# Patient Record
Sex: Male | Born: 1945 | Race: White | Hispanic: No | Smoking: Never smoker
Health system: Southern US, Community
[De-identification: ages and names within clinical notes are randomized; demographics above are authoritative.]

## PROBLEM LIST (undated history)

## (undated) DIAGNOSIS — D649 Anemia, unspecified: Secondary | ICD-10-CM

## (undated) DIAGNOSIS — E785 Hyperlipidemia, unspecified: Secondary | ICD-10-CM

## (undated) DIAGNOSIS — N400 Enlarged prostate without lower urinary tract symptoms: Secondary | ICD-10-CM

## (undated) HISTORY — PX: VASECTOMY: SHX75

## (undated) HISTORY — DX: Hyperlipidemia, unspecified: E78.5

---

## 1999-02-20 ENCOUNTER — Ambulatory Visit (HOSPITAL_BASED_OUTPATIENT_CLINIC_OR_DEPARTMENT_OTHER): Admission: RE | Admit: 1999-02-20 | Discharge: 1999-02-20 | Payer: Self-pay | Admitting: Plastic Surgery

## 2003-09-20 ENCOUNTER — Ambulatory Visit (HOSPITAL_BASED_OUTPATIENT_CLINIC_OR_DEPARTMENT_OTHER): Admission: RE | Admit: 2003-09-20 | Discharge: 2003-09-20 | Payer: Self-pay | Admitting: Plastic Surgery

## 2003-09-20 ENCOUNTER — Encounter (INDEPENDENT_AMBULATORY_CARE_PROVIDER_SITE_OTHER): Payer: Self-pay | Admitting: Specialist

## 2003-09-20 ENCOUNTER — Ambulatory Visit (HOSPITAL_COMMUNITY): Admission: RE | Admit: 2003-09-20 | Discharge: 2003-09-20 | Payer: Self-pay | Admitting: Plastic Surgery

## 2005-07-23 ENCOUNTER — Encounter: Admission: RE | Admit: 2005-07-23 | Discharge: 2005-07-23 | Payer: Self-pay | Admitting: Orthopedic Surgery

## 2005-08-06 ENCOUNTER — Encounter: Admission: RE | Admit: 2005-08-06 | Discharge: 2005-08-06 | Payer: Self-pay | Admitting: Orthopedic Surgery

## 2006-07-16 ENCOUNTER — Encounter: Admission: RE | Admit: 2006-07-16 | Discharge: 2006-07-16 | Payer: Self-pay | Admitting: Orthopedic Surgery

## 2006-08-06 ENCOUNTER — Encounter: Admission: RE | Admit: 2006-08-06 | Discharge: 2006-08-06 | Payer: Self-pay | Admitting: Orthopedic Surgery

## 2006-08-06 DIAGNOSIS — M5416 Radiculopathy, lumbar region: Secondary | ICD-10-CM | POA: Insufficient documentation

## 2008-09-14 ENCOUNTER — Emergency Department (HOSPITAL_COMMUNITY): Admission: EM | Admit: 2008-09-14 | Discharge: 2008-09-14 | Payer: Self-pay | Admitting: Emergency Medicine

## 2011-02-23 NOTE — Op Note (Signed)
NAME:  BUCKY, GRIGG                       ACCOUNT NO.:  [id number]   MEDICAL RECORD NO.:  [id number]                   PATIENT TYPE:  AMB   LOCATION:  DSC                                  FACILITY:  [facility]   PHYSICIAN:  Alfredia Ferguson, M.D.               DATE OF BIRTH:  22-Feb-1946   DATE OF PROCEDURE:  09/20/2003  DATE OF DISCHARGE:                                 OPERATIVE REPORT   PREOPERATIVE DIAGNOSIS:  A 5 mm actinic keratosis with severe atypia, left  cheek.   POSTOPERATIVE DIAGNOSIS:  A 5 mm actinic keratosis with severe atypia, left  cheek.   OPERATION PERFORMED:  Elliptical excision of actinic keratosis, left cheek,  with approximately 2 mm margin.   SURGEON:  Alfredia Ferguson, M.D.   ANESTHESIA:  2% Xylocaine, 1:100,000 epinephrine.   INDICATION FOR SURGERY:  This is a 65 year old gentleman with a biopsy-  proven actinic keratosis with severe atypia.  He wishes to undergo further  excision to clear the margins.  Potential risks of the surgery, including  unsightly scarring and positive margins, were discussed with the patient.  He wishes to proceed.   DESCRIPTION OF SURGERY:  Skin marks were placed around the lesion in  elliptical fashion and local anesthesia using 2% Xylocaine and 1:100,000  epinephrine was infiltrated.  The left cheek was prepped and draped in a  sterile fashion.  An elliptical excision of the lesion down to the level of  the subcutaneous tissue was carried out.  The specimen was passed off for  pathology.  The wound edges were undermined for a distance of several  millimeters in all directions.  The wound was closed by approximating the  dermis using interrupted 5-0 Monocryl suture.  Skin was united using a  running 6-0 nylon suture.  The cheek was cleansed, dried, and a light  dressing was applied.  Follow-up will be provided in four days.                                               Alfredia Ferguson, M.D.    WBB/MEDQ  D:  09/20/2003  T:   09/20/2003  Job:  [id number]

## 2011-07-13 LAB — URINALYSIS, ROUTINE W REFLEX MICROSCOPIC
Bilirubin Urine: NEGATIVE
Glucose, UA: NEGATIVE mg/dL
Hgb urine dipstick: NEGATIVE
Ketones, ur: 15 mg/dL — AB
Nitrite: NEGATIVE
Protein, ur: NEGATIVE mg/dL
Specific Gravity, Urine: 1.028 (ref 1.005–1.030)
Urobilinogen, UA: 0.2 mg/dL (ref 0.0–1.0)
pH: 5.5 (ref 5.0–8.0)

## 2011-07-13 LAB — COMPREHENSIVE METABOLIC PANEL
ALT: 15 U/L (ref 0–53)
AST: 16 U/L (ref 0–37)
Albumin: 3.8 g/dL (ref 3.5–5.2)
Alkaline Phosphatase: 48 U/L (ref 39–117)
BUN: 11 mg/dL (ref 6–23)
CO2: 26 mEq/L (ref 19–32)
Calcium: 8.9 mg/dL (ref 8.4–10.5)
Chloride: 105 mEq/L (ref 96–112)
Creatinine, Ser: 0.96 mg/dL (ref 0.4–1.5)
GFR calc Af Amer: >60 mL/min (ref >60)
GFR calc non Af Amer: >60 mL/min (ref >60)
Glucose, Bld: 127 mg/dL — ABNORMAL HIGH (ref 70–99)
Potassium: 3.5 mEq/L (ref 3.5–5.1)
Sodium: 136 mEq/L (ref 135–145)
Total Bilirubin: 1.3 mg/dL — ABNORMAL HIGH (ref 0.3–1.2)
Total Protein: 6 g/dL (ref 6.0–8.3)

## 2011-07-13 LAB — CBC
HCT: 43.6 % (ref 39.0–52.0)
Hemoglobin: 14.8 g/dL (ref 13.0–17.0)
MCHC: 34 g/dL (ref 30.0–36.0)
MCV: 96 fL (ref 78.0–100.0)
Platelets: 254 10*3/uL (ref 150–400)
RBC: 4.54 MIL/uL (ref 4.22–5.81)
RDW: 13.1 % (ref 11.5–15.5)
WBC: 10.5 10*3/uL (ref 4.0–10.5)

## 2011-07-13 LAB — DIFFERENTIAL
Basophils Absolute: 0 10*3/uL (ref 0.0–0.1)
Basophils Relative: 0 % (ref 0–1)
Eosinophils Absolute: 0 10*3/uL (ref 0.0–0.7)
Eosinophils Relative: 0 % (ref 0–5)
Lymphocytes Relative: 6 % — ABNORMAL LOW (ref 12–46)
Lymphs Abs: 0.7 10*3/uL (ref 0.7–4.0)
Monocytes Absolute: 0.4 10*3/uL (ref 0.1–1.0)
Monocytes Relative: 4 % (ref 3–12)
Neutro Abs: 9.5 10*3/uL — ABNORMAL HIGH (ref 1.7–7.7)
Neutrophils Relative %: 90 % — ABNORMAL HIGH (ref 43–77)

## 2011-07-13 LAB — LIPASE, BLOOD: Lipase: 25 U/L (ref 11–59)

## 2011-07-13 LAB — POCT CARDIAC MARKERS
CKMB, poc: 1.5 ng/mL (ref 1.0–8.0)
Myoglobin, poc: 92.5 ng/mL (ref 12–200)
Troponin i, poc: <0.05 ng/mL (ref 0.00–0.09)

## 2014-12-26 ENCOUNTER — Ambulatory Visit (INDEPENDENT_AMBULATORY_CARE_PROVIDER_SITE_OTHER): Payer: Medicare Other | Admitting: Emergency Medicine

## 2014-12-26 ENCOUNTER — Ambulatory Visit (INDEPENDENT_AMBULATORY_CARE_PROVIDER_SITE_OTHER): Payer: Medicare Other

## 2014-12-26 VITALS — BP 118/66 | HR 82 | Temp 97.8°F | Resp 16 | Ht 69.0 in | Wt 179.2 lb

## 2014-12-26 DIAGNOSIS — M25572 Pain in left ankle and joints of left foot: Secondary | ICD-10-CM

## 2014-12-26 DIAGNOSIS — S92302A Fracture of unspecified metatarsal bone(s), left foot, initial encounter for closed fracture: Secondary | ICD-10-CM

## 2014-12-26 DIAGNOSIS — S8012XA Contusion of left lower leg, initial encounter: Secondary | ICD-10-CM

## 2014-12-26 NOTE — Patient Instructions (Signed)
Metatarsal Fracture, Undisplaced  A metatarsal fracture is a break in the bone(s) of the foot. These are the bones of the foot that connect your toes to the bones of the ankle.  DIAGNOSIS   The diagnoses of these fractures are usually made with X-rays. If there are problems in the forefoot and x-rays are normal a later bone scan will usually make the diagnosis.   TREATMENT AND HOME CARE INSTRUCTIONS  · Treatment may or may not include a cast or walking shoe. When casts are needed the use is usually for short periods of time so as not to slow down healing with muscle wasting (atrophy).  · Activities should be stopped until further advised by your caregiver.  · Wear shoes with adequate shock absorbing capabilities and stiff soles.  · Alternative exercise may be undertaken while waiting for healing. These may include bicycling and swimming, or as your caregiver suggests.  · It is important to keep all follow-up visits or specialty referrals. The failure to keep these appointments could result in improper bone healing and chronic pain or disability.  · Warning: Do not drive a car or operate a motor vehicle until your caregiver specifically tells you it is safe to do so.  IF YOU DO NOT HAVE A CAST OR SPLINT:  · You may walk on your injured foot as tolerated or advised.  · Do not put any weight on your injured foot for as long as directed by your caregiver. Slowly increase the amount of time you walk on the foot as the pain allows or as advised.  · Use crutches until you can bear weight without pain. A gradual increase in weight bearing may help.  · Apply ice to the injury for 15-20 minutes each hour while awake for the first 2 days. Put the ice in a plastic bag and place a towel between the bag of ice and your skin.  · Only take over-the-counter or prescription medicines for pain, discomfort, or fever as directed by your caregiver.  SEEK IMMEDIATE MEDICAL CARE IF:   · Your cast gets damaged or breaks.  · You have  continued severe pain or more swelling than you did before the cast was put on, or the pain is not controlled with medications.  · Your skin or nails below the injury turn blue or grey, or feel cold or numb.  · There is a bad smell, or new stains or pus-like (purulent) drainage coming from the cast.  MAKE SURE YOU:   · Understand these instructions.  · Will watch your condition.  · Will get help right away if you are not doing well or get worse.  Document Released: 06/16/2002 Document Revised: 12/17/2011 Document Reviewed: 05/07/2008  ExitCare® Patient Information ©2015 [employer]. This information is not intended to replace advice given to you by your health care provider. Make sure you discuss any questions you have with your health care provider.

## 2014-12-26 NOTE — Progress Notes (Signed)
[facility] [address] [place] [address] [phone]  Date:  12/26/2014   Name:  David Mahoney   DOB:  07/14/1946   MRN:  [id number]  PCP:  No PCP Per Patient    Chief Complaint: Foot Pain and Leg Pain   History of Present Illness:  David Mahoney is a 69 y.o. very pleasant male patient who presents with the following:  [place] off a 6 foot step ladder while painting a week ago. Landed on his left side. Has a very large hematoma left lateral thigh with distal migration and swelling of lower leg Pain with ambulation in foot, mostly on lateral foot. No head neck or back pain or injury.  No LOC no neuro symptoms No improvement with over the counter medications or other home remedies.  Denies other complaint or health concern today.   There are no active problems to display for this patient.   Past Medical History  Diagnosis Date  . Hyperlipidemia     Past Surgical History  Procedure Laterality Date  . Vasectomy      History  Substance Use Topics  . Smoking status: Never Smoker   . Smokeless tobacco: Not on file  . Alcohol Use: Not on file    Family History  Problem Relation Age of Onset  . Heart disease Mother   . Diabetes Father   . Diabetes Brother     No Known Allergies  Medication list has been reviewed and updated.  No current outpatient prescriptions on file prior to visit.   No current facility-administered medications on file prior to visit.    Review of Systems:  As per HPI, otherwise negative.    Physical Examination: Filed Vitals:   12/26/14 1432  BP: 118/66  Pulse: 82  Temp: 97.8 F (36.6 C)  Resp: 16   Filed Vitals:   12/26/14 1432  Height: 5\' 9"  (1.753 m)  Weight: 179 lb 3.2 oz (81.285 kg)   Body mass index is 26.45 kg/(m^2). Ideal Body Weight: Weight in (lb) to have BMI = 25: 168.9  GEN: WDWN, NAD, Non-toxic, A & O x 3 HEENT: Atraumatic, Normocephalic. Neck supple. No masses, No LAD. Ears  and Nose: No external deformity. CV: RRR, No M/G/R. No JVD. No thrill. No extra heart sounds. PULM: CTA B, no wheezes, crackles, rhonchi. No retractions. No resp. distress. No accessory muscle use. ABD: S, NT, ND, +BS. No rebound. No HSM. EXTR: No c/c/e NEURO Normal gait.  PSYCH: Normally interactive. Conversant. Not depressed or anxious appearing.  Calm demeanor.  LEFT thigh:  Lateral abrasion and large ecchymosis.  Some tenderness CALF:  Moderate swelling and ecchymosis Foot:  Tenderness lateral foot.  Marked ecchymosis and swelling   Assessment and Plan: Fracture fifth MT Boot Large ecchymosis left leg Follow up [facility]   Signed,  Ellison Carwin, MD   [facility] reading (PRIMARY) by  Dr. Ouida Sills.  Fracture fifth MT.

## 2016-06-07 DIAGNOSIS — E7800 Pure hypercholesterolemia, unspecified: Secondary | ICD-10-CM | POA: Insufficient documentation

## 2018-01-14 DIAGNOSIS — H903 Sensorineural hearing loss, bilateral: Secondary | ICD-10-CM | POA: Insufficient documentation

## 2019-12-14 DIAGNOSIS — E785 Hyperlipidemia, unspecified: Secondary | ICD-10-CM | POA: Diagnosis not present

## 2019-12-14 DIAGNOSIS — Z125 Encounter for screening for malignant neoplasm of prostate: Secondary | ICD-10-CM | POA: Diagnosis not present

## 2020-02-18 DIAGNOSIS — Z1331 Encounter for screening for depression: Secondary | ICD-10-CM | POA: Diagnosis not present

## 2020-02-18 DIAGNOSIS — Z719 Counseling, unspecified: Secondary | ICD-10-CM | POA: Diagnosis not present

## 2020-02-18 DIAGNOSIS — Z789 Other specified health status: Secondary | ICD-10-CM | POA: Diagnosis not present

## 2020-02-18 DIAGNOSIS — Z7189 Other specified counseling: Secondary | ICD-10-CM | POA: Diagnosis not present

## 2020-02-18 DIAGNOSIS — Z713 Dietary counseling and surveillance: Secondary | ICD-10-CM | POA: Diagnosis not present

## 2020-02-18 DIAGNOSIS — Z0000 Encounter for general adult medical examination without abnormal findings: Secondary | ICD-10-CM | POA: Diagnosis not present

## 2020-02-21 DIAGNOSIS — S41131A Puncture wound without foreign body of right upper arm, initial encounter: Secondary | ICD-10-CM | POA: Diagnosis not present

## 2020-02-21 DIAGNOSIS — S41051A Open bite of right shoulder, initial encounter: Secondary | ICD-10-CM | POA: Diagnosis not present

## 2020-02-21 DIAGNOSIS — S41151A Open bite of right upper arm, initial encounter: Secondary | ICD-10-CM | POA: Diagnosis not present

## 2020-02-21 DIAGNOSIS — Z23 Encounter for immunization: Secondary | ICD-10-CM | POA: Diagnosis not present

## 2020-06-14 DIAGNOSIS — N3001 Acute cystitis with hematuria: Secondary | ICD-10-CM | POA: Diagnosis not present

## 2020-11-14 DIAGNOSIS — Z6825 Body mass index (BMI) 25.0-25.9, adult: Secondary | ICD-10-CM | POA: Diagnosis not present

## 2020-11-14 DIAGNOSIS — E785 Hyperlipidemia, unspecified: Secondary | ICD-10-CM | POA: Diagnosis not present

## 2020-11-14 DIAGNOSIS — G3184 Mild cognitive impairment, so stated: Secondary | ICD-10-CM | POA: Diagnosis not present

## 2020-11-14 DIAGNOSIS — N400 Enlarged prostate without lower urinary tract symptoms: Secondary | ICD-10-CM | POA: Diagnosis not present

## 2020-11-14 DIAGNOSIS — Z23 Encounter for immunization: Secondary | ICD-10-CM | POA: Diagnosis not present

## 2020-11-14 DIAGNOSIS — E782 Mixed hyperlipidemia: Secondary | ICD-10-CM | POA: Diagnosis not present

## 2020-11-14 DIAGNOSIS — Z634 Disappearance and death of family member: Secondary | ICD-10-CM | POA: Diagnosis not present

## 2020-11-14 DIAGNOSIS — F329 Major depressive disorder, single episode, unspecified: Secondary | ICD-10-CM | POA: Diagnosis not present

## 2021-05-26 DIAGNOSIS — R222 Localized swelling, mass and lump, trunk: Secondary | ICD-10-CM | POA: Diagnosis not present

## 2021-05-26 DIAGNOSIS — Z6826 Body mass index (BMI) 26.0-26.9, adult: Secondary | ICD-10-CM | POA: Diagnosis not present

## 2021-05-26 DIAGNOSIS — M25412 Effusion, left shoulder: Secondary | ICD-10-CM | POA: Diagnosis not present

## 2022-02-23 ENCOUNTER — Encounter (HOSPITAL_COMMUNITY): Payer: Self-pay | Admitting: Emergency Medicine

## 2022-02-23 ENCOUNTER — Other Ambulatory Visit: Payer: Self-pay

## 2022-02-23 ENCOUNTER — Inpatient Hospital Stay (HOSPITAL_COMMUNITY)
Admission: EM | Admit: 2022-02-23 | Discharge: 2022-02-25 | DRG: 375 | Disposition: A | Discharge status: Home / Self Care | Payer: Medicare Other | Attending: Internal Medicine | Admitting: Internal Medicine

## 2022-02-23 DIAGNOSIS — K922 Gastrointestinal hemorrhage, unspecified: Secondary | ICD-10-CM

## 2022-02-23 DIAGNOSIS — D649 Anemia, unspecified: Secondary | ICD-10-CM | POA: Diagnosis not present

## 2022-02-23 DIAGNOSIS — D62 Acute posthemorrhagic anemia: Secondary | ICD-10-CM | POA: Diagnosis present

## 2022-02-23 DIAGNOSIS — K648 Other hemorrhoids: Secondary | ICD-10-CM | POA: Diagnosis present

## 2022-02-23 DIAGNOSIS — Z833 Family history of diabetes mellitus: Secondary | ICD-10-CM

## 2022-02-23 DIAGNOSIS — Z8249 Family history of ischemic heart disease and other diseases of the circulatory system: Secondary | ICD-10-CM | POA: Diagnosis not present

## 2022-02-23 DIAGNOSIS — N400 Enlarged prostate without lower urinary tract symptoms: Secondary | ICD-10-CM | POA: Diagnosis present

## 2022-02-23 DIAGNOSIS — C182 Malignant neoplasm of ascending colon: Secondary | ICD-10-CM | POA: Diagnosis present

## 2022-02-23 DIAGNOSIS — E785 Hyperlipidemia, unspecified: Secondary | ICD-10-CM | POA: Diagnosis present

## 2022-02-23 DIAGNOSIS — K5730 Diverticulosis of large intestine without perforation or abscess without bleeding: Secondary | ICD-10-CM | POA: Diagnosis present

## 2022-02-23 DIAGNOSIS — K5520 Angiodysplasia of colon without hemorrhage: Secondary | ICD-10-CM | POA: Diagnosis present

## 2022-02-23 DIAGNOSIS — K2289 Other specified disease of esophagus: Secondary | ICD-10-CM | POA: Diagnosis not present

## 2022-02-23 DIAGNOSIS — Z79899 Other long term (current) drug therapy: Secondary | ICD-10-CM

## 2022-02-23 DIAGNOSIS — D490 Neoplasm of unspecified behavior of digestive system: Secondary | ICD-10-CM | POA: Diagnosis not present

## 2022-02-23 DIAGNOSIS — K921 Melena: Secondary | ICD-10-CM | POA: Diagnosis not present

## 2022-02-23 DIAGNOSIS — Z7982 Long term (current) use of aspirin: Secondary | ICD-10-CM

## 2022-02-23 DIAGNOSIS — K449 Diaphragmatic hernia without obstruction or gangrene: Secondary | ICD-10-CM | POA: Diagnosis present

## 2022-02-23 DIAGNOSIS — D120 Benign neoplasm of cecum: Secondary | ICD-10-CM | POA: Diagnosis present

## 2022-02-23 DIAGNOSIS — K2950 Unspecified chronic gastritis without bleeding: Secondary | ICD-10-CM | POA: Diagnosis not present

## 2022-02-23 DIAGNOSIS — N401 Enlarged prostate with lower urinary tract symptoms: Secondary | ICD-10-CM | POA: Diagnosis not present

## 2022-02-23 DIAGNOSIS — H9190 Unspecified hearing loss, unspecified ear: Secondary | ICD-10-CM | POA: Diagnosis not present

## 2022-02-23 DIAGNOSIS — G3184 Mild cognitive impairment, so stated: Secondary | ICD-10-CM | POA: Diagnosis present

## 2022-02-23 DIAGNOSIS — K229 Disease of esophagus, unspecified: Secondary | ICD-10-CM | POA: Diagnosis present

## 2022-02-23 DIAGNOSIS — K635 Polyp of colon: Secondary | ICD-10-CM | POA: Diagnosis not present

## 2022-02-23 HISTORY — DX: Benign prostatic hyperplasia without lower urinary tract symptoms: N40.0

## 2022-02-23 LAB — CBC WITH DIFFERENTIAL/PLATELET
Abs Immature Granulocytes: 0.03 10*3/uL (ref 0.00–0.07)
Basophils Absolute: 0 10*3/uL (ref 0.0–0.1)
Basophils Relative: 0 %
Eosinophils Absolute: 0 10*3/uL (ref 0.0–0.5)
Eosinophils Relative: 1 %
HCT: 20.9 % — ABNORMAL LOW (ref 39.0–52.0)
Hemoglobin: 5.7 g/dL — CL (ref 13.0–17.0)
Immature Granulocytes: 0 %
Lymphocytes Relative: 17 %
Lymphs Abs: 1.3 10*3/uL (ref 0.7–4.0)
MCH: 19.3 pg — ABNORMAL LOW (ref 26.0–34.0)
MCHC: 27.3 g/dL — ABNORMAL LOW (ref 30.0–36.0)
MCV: 70.8 fL — ABNORMAL LOW (ref 80.0–100.0)
Monocytes Absolute: 0.6 10*3/uL (ref 0.1–1.0)
Monocytes Relative: 7 %
Neutro Abs: 5.7 10*3/uL (ref 1.7–7.7)
Neutrophils Relative %: 75 %
Platelets: 387 10*3/uL (ref 150–400)
RBC: 2.95 MIL/uL — ABNORMAL LOW (ref 4.22–5.81)
RDW: 17 % — ABNORMAL HIGH (ref 11.5–15.5)
WBC: 7.7 10*3/uL (ref 4.0–10.5)
nRBC: 0 % (ref 0.0–0.2)

## 2022-02-23 LAB — COMPREHENSIVE METABOLIC PANEL
ALT: 19 U/L (ref 0–44)
AST: 17 U/L (ref 15–41)
Albumin: 4.1 g/dL (ref 3.5–5.0)
Alkaline Phosphatase: 41 U/L (ref 38–126)
Anion gap: 9 (ref 5–15)
BUN: 15 mg/dL (ref 8–23)
CO2: 20 mmol/L — ABNORMAL LOW (ref 22–32)
Calcium: 8.8 mg/dL — ABNORMAL LOW (ref 8.9–10.3)
Chloride: 108 mmol/L (ref 98–111)
Creatinine, Ser: 1.21 mg/dL (ref 0.61–1.24)
GFR, Estimated: >60 mL/min (ref >60)
Glucose, Bld: 115 mg/dL — ABNORMAL HIGH (ref 70–99)
Potassium: 4.1 mmol/L (ref 3.5–5.1)
Sodium: 137 mmol/L (ref 135–145)
Total Bilirubin: 1.1 mg/dL (ref 0.3–1.2)
Total Protein: 6.5 g/dL (ref 6.5–8.1)

## 2022-02-23 LAB — PREPARE RBC (CROSSMATCH)

## 2022-02-23 LAB — PROTIME-INR
INR: 1 (ref 0.8–1.2)
Prothrombin Time: 13.2 seconds (ref 11.4–15.2)

## 2022-02-23 LAB — ABO/RH: ABO/RH(D): A NEG

## 2022-02-23 LAB — POC OCCULT BLOOD, ED: Fecal Occult Bld: POSITIVE — AB

## 2022-02-23 MED ORDER — SODIUM CHLORIDE 0.9 % IV SOLN: 10.0000 mL/h | Freq: Once | INTRAVENOUS | Status: DC

## 2022-02-23 MED ORDER — PANTOPRAZOLE SODIUM 40 MG IV SOLR
40.0000 mg | Freq: Two times a day (BID) | INTRAVENOUS | Status: DC
  Administered 2022-02-24 – 2022-02-25 (×3): 40 mg via INTRAVENOUS
  Filled 2022-02-23 (×3): qty 10

## 2022-02-23 MED ORDER — PANTOPRAZOLE SODIUM 40 MG IV SOLR
40.0000 mg | Freq: Once | INTRAVENOUS | Status: AC
  Administered 2022-02-23: 40 mg via INTRAVENOUS
  Filled 2022-02-23: qty 10

## 2022-02-23 MED ORDER — LORAZEPAM 2 MG/ML IJ SOLN
0.2500 mg | Freq: Once | INTRAMUSCULAR | Status: AC
Start: 2022-02-24 — End: 2022-02-24
  Administered 2022-02-24: 0.25 mg via INTRAVENOUS
  Filled 2022-02-23: qty 1

## 2022-02-23 NOTE — ED Provider Notes (Signed)
[facility] EMERGENCY DEPARTMENT Provider Note   CSN: [id number] Arrival date & time: 02/23/22  1454     History  Chief Complaint  Patient presents with   Rectal Bleeding    David Mahoney is a 76 y.o. male.  Patient presents ER chief complaint of bloody stool.  Patient lives alone but states that he had a bloody stool yesterday.  He called his daughter today to let her know when he was brought to the primary care doctor's office.  Blood tests were done showing hemoglobin 5.5, advised to go to the ER.  Patient states the bleeding appears to have stopped today.  Denies any headache or chest pain or abdominal pain no prior history of GI bleed.  Does not have a gastroenterologist, otherwise denies any fevers or cough or vomiting or diarrhea.  Takes baby aspirin daily which she took today.      Home Medications Prior to Admission medications   Not on File      Allergies    Patient has no known allergies.    Review of Systems   Review of Systems  Constitutional:  Negative for fever.  HENT:  Negative for ear pain and sore throat.   Eyes:  Negative for pain.  Respiratory:  Negative for cough.   Cardiovascular:  Negative for chest pain.  Gastrointestinal:  Negative for abdominal pain.  Genitourinary:  Negative for flank pain.  Musculoskeletal:  Negative for back pain.  Skin:  Negative for color change and rash.  Neurological:  Negative for syncope.  All other systems reviewed and are negative.  Physical Exam Updated Vital Signs BP 130/77 (BP Location: Left Arm)   Pulse 74   Temp (!) 97.5 F (36.4 C) (Oral)   Resp 16   Ht '5\' 8"'$  (1.727 m)   Wt 74.8 kg   SpO2 100%   BMI 25.09 kg/m  Physical Exam Constitutional:      Appearance: He is well-developed.  HENT:     Head: Normocephalic.     Nose: Nose normal.  Eyes:     Extraocular Movements: Extraocular movements intact.  Cardiovascular:     Rate and Rhythm: Normal rate.  Pulmonary:     Effort:  Pulmonary effort is normal.  Genitourinary:    Comments: Rectal exam shows sparse, dark appearing stool.  Guaiac positive. Skin:    Coloration: Skin is not jaundiced.  Neurological:     Mental Status: He is alert. Mental status is at baseline.    ED Results / Procedures / Treatments   Labs (all labs ordered are listed, but only abnormal results are displayed) Labs Reviewed  COMPREHENSIVE METABOLIC PANEL - Abnormal; Notable for the following components:      Result Value   CO2 20 (*)    Glucose, Bld 115 (*)    Calcium 8.8 (*)    All other components within normal limits  CBC WITH DIFFERENTIAL/PLATELET - Abnormal; Notable for the following components:   RBC 2.95 (*)    Hemoglobin 5.7 (*)    HCT 20.9 (*)    MCV 70.8 (*)    MCH 19.3 (*)    MCHC 27.3 (*)    RDW 17.0 (*)    All other components within normal limits  POC OCCULT BLOOD, ED - Abnormal; Notable for the following components:   Fecal Occult Bld POSITIVE (*)    All other components within normal limits  PROTIME-INR  TYPE AND SCREEN  ABO/RH  PREPARE RBC (CROSSMATCH)  EKG None  Radiology No results found.  Procedures .Critical Care Performed by: Luna Fuse, MD Authorized by: Luna Fuse, MD   Critical care provider statement:    Critical care time (minutes):  40   Critical care time was exclusive of:  Separately billable procedures and treating other patients and teaching time   Critical care was necessary to treat or prevent imminent or life-threatening deterioration of the following conditions:  Circulatory failure Comments:     Severe anemia, emergent blood transfusion    Medications Ordered in ED Medications  0.9 %  sodium chloride infusion (has no administration in time range)  pantoprazole (PROTONIX) injection 40 mg (40 mg Intravenous Given 02/23/22 1746)    ED Course/ Medical Decision Making/ A&P                           Medical Decision Making Amount and/or Complexity of Data  Reviewed Labs: ordered.  Risk Prescription drug management.   History obtained from daughter at bedside.  Cardiac monitoring showing sinus rhythm.  Review of records shows prior PCP visit today.   Labs are sent, concerning for hemoglobin 5.7.  Chemistry otherwise unremarkable.  Risk and benefits discussed, patient to be transfused 2 units of PRBCs.  Started on IV Protonix no drip started after consultation with GI consistent with the recommendations.  Will be mated to the hospitalist team.  Case discussed with on-call [facility] Dr.Gessner, who will see the patient tomorrow morning.        Final Clinical Impression(s) / ED Diagnoses Final diagnoses:  Gastrointestinal hemorrhage, unspecified gastrointestinal hemorrhage type  Severe anemia    Rx / DC Orders ED Discharge Orders     None         Luna Fuse, MD 02/23/22 1750

## 2022-02-23 NOTE — ED Triage Notes (Signed)
Pt. Stated, I had an episode of bleeding rectally yesterday, it keep on coming and it finally stopped no more episodes.

## 2022-02-23 NOTE — ED Provider Triage Note (Addendum)
Emergency Medicine Provider Triage Evaluation Note  David Mahoney , a 76 y.o. male  was evaluated in triage.  Pt complains of rectal bleeding onset yesterday.  Patient notes that he noted rectal bleeding when wiping and in the toilet as well.  He has his rectal bleeding has resolved at this time.  No meds prior to arrival.  Denies chest pain, shortness of breath, palpitations, abdominal pain. Denies history of hemorrhoids. Denies anticoagulants.   Review of Systems  Positive: As per HPI above Negative:   Physical Exam  BP 130/77 (BP Location: Left Arm)   Pulse 74   Temp (!) 97.5 F (36.4 C) (Oral)   Resp 16   Ht '5\' 8"'$  (1.727 m)   Wt 74.8 kg   SpO2 100%   BMI 25.09 kg/m  Gen:   Awake, no distress   Resp:  Normal effort  MSK:   Moves extremities without difficulty  Other:  Rectal exam deferred in triage.  No abdominal tenderness to palpation  Medical Decision Making  Medically screening exam initiated at 3:02 PM.  Appropriate orders placed.  David Mahoney was informed that the remainder of the evaluation will be completed by another provider, this initial triage assessment does not replace that evaluation, and the importance of remaining in the ED until their evaluation is complete.  Work-up initiated.   Dionicio Shelnutt A, PA-C 02/23/22 1515    Marymargaret Kirker A, PA-C 02/23/22 1516

## 2022-02-23 NOTE — H&P (Addendum)
History and Physical    David Mahoney UXL:[id number] DOB: Nov 26, 1945 DOA: 02/23/2022  PCP: [name], MD (Confirm with patient/family/NH records and if not entered, this has to be entered at [facility] point of entry) Patient coming from: Home  I have personally briefly reviewed patient's old medical records in [place]  Chief Complaint: Bleeding stopped  HPI: David Mahoney is a 76 y.o. male with medical history significant of mild cognitive impairment, came with new onset of rectal bleed.  Baseline, patient lives by himself after wife passed away 2 years ago.  No history of GI bleed and no history of either EGD or colonoscopy.  Patient woke up this morning and bathroom and had a large bloody bowel movement, which he described as liquid form bright red blood " pouring into the bottom of the toilet bowl" only 1 episode.  She was not sure about whether was any blood clot.  Denies any abdominal pain, no nauseous vomiting, no lightheadedness, no chest pain or shortness of breath.  Denies any weight loss this year.  He told that to the daughter and daughter brought him to PCP, and CBC and the office showed hemoglobin 5.5 and patient sent to the ED.  ED Course: No tachycardia no hypotension  Repeat CBC in the ED showed hemoglobin 5.7  GI consulted, recommend PPI twice daily   Review of Systems: As per HPI otherwise 14 point review of systems negative.    Past Medical History:  Diagnosis Date   Hyperlipidemia     Past Surgical History:  Procedure Laterality Date   VASECTOMY       reports that he has never smoked. He does not have any smokeless tobacco history on file. He reports current alcohol use. He reports that he does not currently use drugs.  No Known Allergies  Family History  Problem Relation Age of Onset   Heart disease Mother    Diabetes Father    Diabetes Brother      Prior to Admission medications   Medication Sig Start Date End Date Taking? Authorizing  Provider  aspirin EC 81 MG tablet Take 81 mg by mouth daily. Swallow whole.   Yes [provider]  pyridOXINE (VITAMIN B-6) 100 MG tablet Take 100 mg by mouth daily.   Yes [provider]  vitamin B-12 (CYANOCOBALAMIN) 100 MCG tablet Take 100 mcg by mouth daily.   Yes [provider]  zinc sulfate 220 (50 Zn) MG capsule Take 220 mg by mouth daily.   Yes [provider]    Physical Exam: Vitals:   02/23/22 1745 02/23/22 1815 02/23/22 1844 02/23/22 1930  BP: 125/67 117/80 112/73 132/66  Pulse: 68 (!) 126 70 79  Resp: '15 15 16 15  '$ Temp:  98.1 F (36.7 C) 98.3 F (36.8 C)   TempSrc:  Oral Oral   SpO2: 100% 100% 100% 97%  Weight:      Height:        Constitutional: NAD, calm, comfortable Vitals:   02/23/22 1745 02/23/22 1815 02/23/22 1844 02/23/22 1930  BP: 125/67 117/80 112/73 132/66  Pulse: 68 (!) 126 70 79  Resp: '15 15 16 15  '$ Temp:  98.1 F (36.7 C) 98.3 F (36.8 C)   TempSrc:  Oral Oral   SpO2: 100% 100% 100% 97%  Weight:      Height:       Eyes: PERRL, lids and conjunctivae normal ENMT: Mucous membranes are moist. Posterior pharynx clear of any exudate  or lesions.Normal dentition.  Neck: normal, supple, no masses, no thyromegaly Respiratory: clear to auscultation bilaterally, no wheezing, no crackles. Normal respiratory effort. No accessory muscle use.  Cardiovascular: Regular rate and rhythm, no murmurs / rubs / gallops. No extremity edema. 2+ pedal pulses. No carotid bruits.  Abdomen: no tenderness, no masses palpated. No hepatosplenomegaly. Bowel sounds positive.  Musculoskeletal: no clubbing / cyanosis. No joint deformity upper and lower extremities. Good ROM, no contractures. Normal muscle tone.  Skin: no rashes, lesions, ulcers. No induration Neurologic: CN 2-12 grossly intact. Sensation intact, DTR normal. Strength 5/5 in all 4.  Psychiatric: Normal judgment and insight. Alert and oriented x 3. Normal mood.     Labs on  Admission: I have personally reviewed following labs and imaging studies  CBC: Recent Labs  Lab 02/23/22 1528  WBC 7.7  NEUTROABS 5.7  HGB 5.7*  HCT 20.9*  MCV 70.8*  PLT 194   Basic Metabolic Panel: Recent Labs  Lab 02/23/22 1528  NA 137  K 4.1  CL 108  CO2 20*  GLUCOSE 115*  BUN 15  CREATININE 1.21  CALCIUM 8.8*   GFR: Estimated Creatinine Clearance: 50.2 mL/min (by C-G formula based on SCr of 1.21 mg/dL). Liver Function Tests: Recent Labs  Lab 02/23/22 1528  AST 17  ALT 19  ALKPHOS 41  BILITOT 1.1  PROT 6.5  ALBUMIN 4.1   No results for input(s): LIPASE, AMYLASE in the last 168 hours. No results for input(s): AMMONIA in the last 168 hours. Coagulation Profile: Recent Labs  Lab 02/23/22 1528  INR 1.0   Cardiac Enzymes: No results for input(s): CKTOTAL, CKMB, CKMBINDEX, TROPONINI in the last 168 hours. BNP (last 3 results) No results for input(s): PROBNP in the last 8760 hours. HbA1C: No results for input(s): HGBA1C in the last 72 hours. CBG: No results for input(s): GLUCAP in the last 168 hours. Lipid Profile: No results for input(s): CHOL, HDL, LDLCALC, TRIG, CHOLHDL, LDLDIRECT in the last 72 hours. Thyroid Function Tests: No results for input(s): TSH, T4TOTAL, FREET4, T3FREE, THYROIDAB in the last 72 hours. Anemia Panel: No results for input(s): VITAMINB12, FOLATE, FERRITIN, TIBC, IRON, RETICCTPCT in the last 72 hours. Urine analysis:    Component Value Date/Time   COLORURINE YELLOW 09/14/2008 1435   APPEARANCEUR CLEAR 09/14/2008 1435   LABSPEC 1.028 09/14/2008 1435   PHURINE 5.5 09/14/2008 1435   GLUCOSEU NEGATIVE 09/14/2008 1435   HGBUR NEGATIVE 09/14/2008 1435   BILIRUBINUR NEGATIVE 09/14/2008 1435   KETONESUR 15 (A) 09/14/2008 1435   PROTEINUR NEGATIVE 09/14/2008 1435   UROBILINOGEN 0.2 09/14/2008 1435   NITRITE NEGATIVE 09/14/2008 1435   LEUKOCYTESUR  09/14/2008 1435    NEGATIVE MICROSCOPIC NOT DONE ON URINES WITH NEGATIVE PROTEIN,  BLOOD, LEUKOCYTES, NITRITE, OR GLUCOSE <1000 mg/dL.    Radiological Exams on Admission: No results found.  EKG: Ordered.  Assessment/Plan Principal Problem:   Lower GI bleed  (please populate well all problems here in Problem List. (For example, if patient is on BP meds at home and you resume or decide to hold them, it is a problem that needs to be her. Same for CAD, COPD, HLD and so on)  Acute blood loss anemia -Probably acute on chronic given there is a concurrent microcytic morphology -Suspect lower GI source given there is normal BUN/creatinine ratio, but will continue PPI twice daily for now as per GI. -PRBC x2 and repeat H&H tomorrow morning -Iron study and reticulocyte count  Acute lower GI bleed -As above  Mild cognitive impairment -Mentation at baseline  DVT prophylaxis: SCD Code Status: Full code Family Communication: Daughter at bedside Disposition Plan: Expect more than 2 midnight hospital stay, expect inpatient colonoscopy. Consults called: [facility] Admission status: Tele admit   Lequita Halt MD [facility] Pager [phone]  02/23/2022, 7:40 PM

## 2022-02-24 ENCOUNTER — Encounter (HOSPITAL_COMMUNITY): Payer: Self-pay | Admitting: Internal Medicine

## 2022-02-24 DIAGNOSIS — K922 Gastrointestinal hemorrhage, unspecified: Secondary | ICD-10-CM | POA: Diagnosis not present

## 2022-02-24 DIAGNOSIS — K2289 Other specified disease of esophagus: Secondary | ICD-10-CM

## 2022-02-24 LAB — RETICULOCYTES
Immature Retic Fract: 30.6 % — ABNORMAL HIGH (ref 2.3–15.9)
RBC.: 3 MIL/uL — ABNORMAL LOW (ref 4.22–5.81)
Retic Count, Absolute: 35.4 10*3/uL (ref 19.0–186.0)
Retic Ct Pct: 1.2 % (ref 0.4–3.1)

## 2022-02-24 LAB — HEMOGLOBIN AND HEMATOCRIT, BLOOD
HCT: 21 % — ABNORMAL LOW (ref 39.0–52.0)
HCT: 25 % — ABNORMAL LOW (ref 39.0–52.0)
HCT: 25.1 % — ABNORMAL LOW (ref 39.0–52.0)
Hemoglobin: 6.3 g/dL — CL (ref 13.0–17.0)
Hemoglobin: 7.7 g/dL — ABNORMAL LOW (ref 13.0–17.0)
Hemoglobin: 7.4 g/dL — ABNORMAL LOW (ref 13.0–17.0)

## 2022-02-24 LAB — PREPARE RBC (CROSSMATCH)

## 2022-02-24 LAB — CBC
HCT: 21.2 % — ABNORMAL LOW (ref 39.0–52.0)
Hemoglobin: 6.3 g/dL — CL (ref 13.0–17.0)
MCH: 21.1 pg — ABNORMAL LOW (ref 26.0–34.0)
MCHC: 29.7 g/dL — ABNORMAL LOW (ref 30.0–36.0)
MCV: 70.9 fL — ABNORMAL LOW (ref 80.0–100.0)
Platelets: 316 10*3/uL (ref 150–400)
RBC: 2.99 MIL/uL — ABNORMAL LOW (ref 4.22–5.81)
RDW: 17.6 % — ABNORMAL HIGH (ref 11.5–15.5)
WBC: 5.1 10*3/uL (ref 4.0–10.5)
nRBC: 0 % (ref 0.0–0.2)

## 2022-02-24 LAB — FERRITIN: Ferritin: 7 ng/mL — ABNORMAL LOW (ref 24–336)

## 2022-02-24 LAB — IRON AND TIBC
Iron: 199 ug/dL — ABNORMAL HIGH (ref 45–182)
Saturation Ratios: 37 % (ref 17.9–39.5)
TIBC: 535 ug/dL — ABNORMAL HIGH (ref 250–450)
UIBC: 336 ug/dL

## 2022-02-24 MED ORDER — LORAZEPAM 2 MG/ML IJ SOLN
0.2500 mg | Freq: Once | INTRAMUSCULAR | Status: AC
  Administered 2022-02-24: 0.25 mg via INTRAVENOUS
  Filled 2022-02-24: qty 1

## 2022-02-24 MED ORDER — PEG-KCL-NACL-NASULF-NA ASC-C 100 G PO SOLR
0.5000 | Freq: Once | ORAL | Status: AC
  Administered 2022-02-24: 100 g via ORAL
  Filled 2022-02-24: qty 1

## 2022-02-24 MED ORDER — SODIUM CHLORIDE 0.9 % IV SOLN: INTRAVENOUS | Status: DC

## 2022-02-24 MED ORDER — PEG-KCL-NACL-NASULF-NA ASC-C 100 G PO SOLR
1.0000 | Freq: Once | ORAL | Status: DC
Start: 2022-02-24 — End: 2022-02-24

## 2022-02-24 MED ORDER — SODIUM CHLORIDE 0.9% IV SOLUTION: Freq: Once | INTRAVENOUS | Status: AC

## 2022-02-24 NOTE — Progress Notes (Signed)
PROGRESS NOTE  David Mahoney  QQP:[id number] DOB: Mar 01, 1946 DOA: 02/23/2022 PCP: [name], MD   Brief Narrative: Patient is a 76 year old male with history of mild cognitive impairment who presented to the emergency department from home with complaints of rectal bleeding.  He has never done a colonoscopy recently in the past.  He reported bright red blood pouring into the toilet bowl, 1 episode.  On presentation, his hemoglobin was in the range of 5.  Patient was transfused with 2 units of PRBC, repeat hemoglobin is still 6.  Being transfused again.  GI consulted.  Currently hemodynamically stable  Assessment & Plan:  Principal Problem:   Lower GI bleed  Lower GI bleed: Unclear etiology.  Possibility of diverticular bleed.  No history of EGD or colonoscopy yet.  Presented with frank passes of bright red blood into the toilet bowl, 1 episode.  Continue Protonix for now.  GI consulted and following.Likely the plan is for colonoscopy. If patient develops  persistent  active bleeding, will consult IR for tagged nuclear scan/CT angio for embolization  Acute blood loss anemia: Presented with hemoglobin in the range of 5.  Transfused with 2 units of PRBC, repeat hemoglobin is still in the range of 6, will transfuse another unit.  Total of 3 units of transfusion.  Monitor H&H every 8 hours.  Mild cognitive impairment: Currently at baseline.  Delirium precautions          DVT prophylaxis:SCDs Start: 02/23/22 1814     Code Status: Full Code  Family Communication: Daughter at bedside  Patient status:Inpatient  Patient is from :Home  Anticipated discharge to:After full work up  Estimated DC date: In 1 to 2 days   Consultants: GI  Procedures:None yet  Antimicrobials:  Anti-infectives (From admission, onward)    None       Subjective: Patient seen and examined at bedside this morning.  Hemodynamically stable.  Eager to go home.  Daughter at the bedside.  Explained about  the importance of staying in the hospital for further work-up of his lower GI bleed  Objective: Vitals:   02/23/22 2308 02/24/22 0008 02/24/22 0142 02/24/22 0606  BP: 110/65 120/71 (!) 127/55 112/61  Pulse: 72 69 71 71  Resp: '18 20 18 18  '$ Temp: 98.2 F (36.8 C) 98.4 F (36.9 C) 97.9 F (36.6 C) 98.2 F (36.8 C)  TempSrc: Oral Oral Oral   SpO2: 100% 100% 98% 99%  Weight:      Height:        Intake/Output Summary (Last 24 hours) at 02/24/2022 0804 Last data filed at 02/24/2022 0142 Gross per 24 hour  Intake 1133 ml  Output --  Net 1133 ml   Filed Weights   02/23/22 1506  Weight: 74.8 kg    Examination:  General exam: Overall comfortable, not in distress HEENT: PERRL Respiratory system:  no wheezes or crackles  Cardiovascular system: S1 & S2 heard, RRR.  Gastrointestinal system: Abdomen is nondistended, soft and nontender. Central nervous system: Alert and awake, mostly  oriented Extremities: No edema, no clubbing ,no cyanosis Skin: No rashes, no ulcers,no icterus     Data Reviewed: I have personally reviewed following labs and imaging studies  CBC: Recent Labs  Lab 02/23/22 1528 02/24/22 0305 02/24/22 0637  WBC 7.7 5.1  --   NEUTROABS 5.7  --   --   HGB 5.7* 6.3* 6.3*  HCT 20.9* 21.2* 21.0*  MCV 70.8* 70.9*  --   PLT 387 316  --  Basic Metabolic Panel: Recent Labs  Lab 02/23/22 1528  NA 137  K 4.1  CL 108  CO2 20*  GLUCOSE 115*  BUN 15  CREATININE 1.21  CALCIUM 8.8*     No results found for this or any previous visit (from the past 240 hour(s)).   Radiology Studies: No results found.  Scheduled Meds:  sodium chloride   Intravenous Once   pantoprazole (PROTONIX) IV  40 mg Intravenous Q12H   Continuous Infusions:  sodium chloride       LOS: 1 day   Shelly Coss, MD [facility] P5/20/2023, 8:04 AM

## 2022-02-24 NOTE — Consult Note (Signed)
Consultation  Referring Provider: Dr. Tawanna Solo     Primary Care Physician:  [name], MD Primary Gastroenterologist: Althia Forts        Reason for Consultation: Anemia/rectal bleeding             HPI:   David Mahoney is a 76 y.o. male with a past medical history as listed below significant for mild cognitive impairment, who presented to the ER on 02/23/2022 with new onset of rectal bleeding.    Today, the patient describes that he lives by himself after his wife passed away 2 years ago.  He has never seen GI previously.  He woke up on the morning of 02/22/2022 and went to the bathroom and had a large bloody bowel movement with bright red blood "pouring into the bottom of the toilet bowl", this was repeated 2 more times that morning.  He told his daughter about it the next day and she brought him to the ER.  She works as a Stage manager.  He tells me he has not seen any blood since then, had two normal brown stools this morning.  In general he feels perfectly fine.  Tells me he has not been to see a doctor in years.  Does think he had a colonoscopy but it was years ago and he is not sure where.  Does take a daily baby aspirin.    Denies fever, chills, abdominal pain, weight loss, nausea, vomiting, heartburn or reflux.  ER course: Hemoglobin 5.7-->1 u prbcs-->6.3  GI history: None  Past Medical History:  Diagnosis Date   BPH (benign prostatic hyperplasia)    Per daughter   Hyperlipidemia     Past Surgical History:  Procedure Laterality Date   VASECTOMY      Family History  Problem Relation Age of Onset   Heart disease Mother    Diabetes Father    Diabetes Brother      Social History   Tobacco Use   Smoking status: Never  Substance Use Topics   Alcohol use: Yes   Drug use: Not Currently    Prior to Admission medications   Medication Sig Start Date End Date Taking? Authorizing Provider  aspirin EC 81 MG tablet Take 81 mg by mouth daily. Swallow whole.   Yes  [provider]  CALCIUM PO Take 1 tablet by mouth daily.   Yes [provider]  OVER THE COUNTER MEDICATION Super beta prostate   Yes [provider]  pyridOXINE (VITAMIN B-6) 100 MG tablet Take 100 mg by mouth daily.   Yes [provider]  Saw Palmetto 450 MG CAPS Take 1 capsule by mouth daily.   Yes [provider]  vitamin B-12 (CYANOCOBALAMIN) 100 MCG tablet Take 100 mcg by mouth daily.   Yes [provider]  Vitamins-Lipotropics (LIPO-FLAVONOID PLUS PO) Take 1 tablet by mouth daily.   Yes [provider]  zinc sulfate 220 (50 Zn) MG capsule Take 220 mg by mouth daily.   Yes [provider]    Current Facility-Administered Medications  Medication Dose Route Frequency Provider Last Rate Last Admin   0.9 %  sodium chloride infusion (Manually program via Guardrails IV Fluids)   Intravenous Once Adhikari, Amrit, MD       0.9 %  sodium chloride infusion  10 mL/hr Intravenous Once [place], Greggory Brandy, MD       0.9 %  sodium chloride infusion   Intravenous Continuous Shelly Coss, MD 75 mL/hr at  02/24/22 0820 New Bag at 02/24/22 0820   pantoprazole (PROTONIX) injection 40 mg  40 mg Intravenous Q12H Wynetta Fines T, MD   40 mg at 02/24/22 0818    Allergies as of 02/23/2022   (No Known Allergies)   Review of Systems:    Constitutional: No weight loss, fever or chills Skin: No rash  Cardiovascular: No chest pain  Respiratory: No SOB  Gastrointestinal: See HPI and otherwise negative Genitourinary: No dysuria or change in urinary frequency Neurological: No headache, dizziness or syncope Musculoskeletal: No new muscle or joint pain Hematologic: No bruising Psychiatric: No history of depression or anxiety    Physical Exam:  Vital signs in last 24 hours: Temp:  [97.5 F (36.4 C)-98.4 F (36.9 C)] 98.4 F (36.9 C) (05/20 0928) Pulse Rate:  [68-126] 73 (05/20 0928) Resp:  [15-20] 18 (05/20 0928) BP: (110-132)/(55-80)  114/69 (05/20 0928) SpO2:  [97 %-100 %] 100 % (05/20 0928) Weight:  [74.8 kg] 74.8 kg (05/19 1506) Last BM Date : 02/23/22 General:   Pleasant Caucasian male appears to be in NAD, Well developed, Well nourished, alert and cooperative Head:  Normocephalic and atraumatic. Eyes:   PEERL, EOMI. No icterus. Conjunctiva pink. Ears:  Normal auditory acuity. Neck:  Supple Throat: Oral cavity and pharynx without inflammation, swelling or lesion. Teeth in good condition. Lungs: Respirations even and unlabored. Lungs clear to auscultation bilaterally.   No wheezes, crackles, or rhonchi.  Heart: Normal S1, S2. No MRG. Regular rate and rhythm. No peripheral edema, cyanosis or pallor.  Abdomen:  Soft, nondistended, nontender. No rebound or guarding. Normal bowel sounds. No appreciable masses or hepatomegaly. Rectal:  Not performed.  Msk:  Symmetrical without gross deformities. Peripheral pulses intact.  Extremities:  Without edema, no deformity or joint abnormality. Normal ROM, normal sensation. Neurologic:  Alert and  oriented x4;  grossly normal neurologically.  Skin:   Dry and intact without significant lesions or rashes. Psychiatric: Demonstrates good judgement and reason without abnormal affect or behaviors.  LAB RESULTS: Recent Labs    02/23/22 1528 02/24/22 0305 02/24/22 0637  WBC 7.7 5.1  --   HGB 5.7* 6.3* 6.3*  HCT 20.9* 21.2* 21.0*  PLT 387 316  --    BMET Recent Labs    02/23/22 1528  NA 137  K 4.1  CL 108  CO2 20*  GLUCOSE 115*  BUN 15  CREATININE 1.21  CALCIUM 8.8*   LFT Recent Labs    02/23/22 1528  PROT 6.5  ALBUMIN 4.1  AST 17  ALT 19  ALKPHOS 41  BILITOT 1.1   PT/INR Recent Labs    02/23/22 1528  LABPROT 13.2  INR 1.0    Impression / Plan:   Impression: 1.  GI bleed with acute blood loss anemia: Describes 1 episode of a large amount of bright red blood with a bowel movement yesterday morning 02/23/2022, hemoglobin 5.7--> 1 unit PRBC--> 6.3--> 1 unit  PRBCs ordered 2.  Mild cognitive impairment  Plan: 1.  Scheduled patient for an EGD and colonoscopy tomorrow given anemia.  This is scheduled with Dr. Lorenso Courier.  Did discuss risks, benefits, limitations and alternatives and patient agrees to proceed. 2.  Patient to remain on clear liquid diet today and n.p.o. at midnight 3.  Continue to monitor hemoglobin with transfusion as needed less than 7 4.  Patient will start movi prep in split dose fashion later this afternoon.  Thank you for your kind consultation, we will continue to follow.  Anderson Malta  Tasia Catchings  02/24/2022, 9:32 AM

## 2022-02-24 NOTE — Plan of Care (Signed)
  Problem: Clinical Measurements: Goal: Diagnostic test results will improve Outcome: Progressing   Problem: Coping: Goal: Level of anxiety will decrease Outcome: Progressing   Problem: Education: Goal: Ability to identify signs and symptoms of gastrointestinal bleeding will improve Outcome: Progressing   Problem: Bowel/Gastric: Goal: Will show no signs and symptoms of gastrointestinal bleeding Outcome: Progressing   Problem: Fluid Volume: Goal: Will show no signs and symptoms of excessive bleeding Outcome: Progressing   Problem: Clinical Measurements: Goal: Complications related to the disease process, condition or treatment will be avoided or minimized Outcome: Progressing

## 2022-02-24 NOTE — H&P (View-Only) (Signed)
Consultation  Referring Provider: Dr. Tawanna Solo     Primary Care Physician:  [name], MD Primary Gastroenterologist: Althia Forts        Reason for Consultation: Anemia/rectal bleeding             HPI:   David Mahoney is a 76 y.o. male with a past medical history as listed below significant for mild cognitive impairment, who presented to the ER on 02/23/2022 with new onset of rectal bleeding.    Today, the patient describes that he lives by himself after his wife passed away 2 years ago.  He has never seen GI previously.  He woke up on the morning of 02/22/2022 and went to the bathroom and had a large bloody bowel movement with bright red blood "pouring into the bottom of the toilet bowl", this was repeated 2 more times that morning.  He told his daughter about it the next day and she brought him to the ER.  She works as a Stage manager.  He tells me he has not seen any blood since then, had two normal brown stools this morning.  In general he feels perfectly fine.  Tells me he has not been to see a doctor in years.  Does think he had a colonoscopy but it was years ago and he is not sure where.  Does take a daily baby aspirin.    Denies fever, chills, abdominal pain, weight loss, nausea, vomiting, heartburn or reflux.  ER course: Hemoglobin 5.7-->1 u prbcs-->6.3  GI history: None  Past Medical History:  Diagnosis Date   BPH (benign prostatic hyperplasia)    Per daughter   Hyperlipidemia     Past Surgical History:  Procedure Laterality Date   VASECTOMY      Family History  Problem Relation Age of Onset   Heart disease Mother    Diabetes Father    Diabetes Brother      Social History   Tobacco Use   Smoking status: Never  Substance Use Topics   Alcohol use: Yes   Drug use: Not Currently    Prior to Admission medications   Medication Sig Start Date End Date Taking? Authorizing Provider  aspirin EC 81 MG tablet Take 81 mg by mouth daily. Swallow whole.   Yes  [provider]  CALCIUM PO Take 1 tablet by mouth daily.   Yes [provider]  OVER THE COUNTER MEDICATION Super beta prostate   Yes [provider]  pyridOXINE (VITAMIN B-6) 100 MG tablet Take 100 mg by mouth daily.   Yes [provider]  Saw Palmetto 450 MG CAPS Take 1 capsule by mouth daily.   Yes [provider]  vitamin B-12 (CYANOCOBALAMIN) 100 MCG tablet Take 100 mcg by mouth daily.   Yes [provider]  Vitamins-Lipotropics (LIPO-FLAVONOID PLUS PO) Take 1 tablet by mouth daily.   Yes [provider]  zinc sulfate 220 (50 Zn) MG capsule Take 220 mg by mouth daily.   Yes [provider]    Current Facility-Administered Medications  Medication Dose Route Frequency Provider Last Rate Last Admin   0.9 %  sodium chloride infusion (Manually program via Guardrails IV Fluids)   Intravenous Once Adhikari, Amrit, MD       0.9 %  sodium chloride infusion  10 mL/hr Intravenous Once [place], Greggory Brandy, MD       0.9 %  sodium chloride infusion   Intravenous Continuous Shelly Coss, MD 75 mL/hr at  02/24/22 0820 New Bag at 02/24/22 0820   pantoprazole (PROTONIX) injection 40 mg  40 mg Intravenous Q12H Wynetta Fines T, MD   40 mg at 02/24/22 0818    Allergies as of 02/23/2022   (No Known Allergies)   Review of Systems:    Constitutional: No weight loss, fever or chills Skin: No rash  Cardiovascular: No chest pain  Respiratory: No SOB  Gastrointestinal: See HPI and otherwise negative Genitourinary: No dysuria or change in urinary frequency Neurological: No headache, dizziness or syncope Musculoskeletal: No new muscle or joint pain Hematologic: No bruising Psychiatric: No history of depression or anxiety    Physical Exam:  Vital signs in last 24 hours: Temp:  [97.5 F (36.4 C)-98.4 F (36.9 C)] 98.4 F (36.9 C) (05/20 0928) Pulse Rate:  [68-126] 73 (05/20 0928) Resp:  [15-20] 18 (05/20 0928) BP: (110-132)/(55-80)  114/69 (05/20 0928) SpO2:  [97 %-100 %] 100 % (05/20 0928) Weight:  [74.8 kg] 74.8 kg (05/19 1506) Last BM Date : 02/23/22 General:   Pleasant Caucasian male appears to be in NAD, Well developed, Well nourished, alert and cooperative Head:  Normocephalic and atraumatic. Eyes:   PEERL, EOMI. No icterus. Conjunctiva pink. Ears:  Normal auditory acuity. Neck:  Supple Throat: Oral cavity and pharynx without inflammation, swelling or lesion. Teeth in good condition. Lungs: Respirations even and unlabored. Lungs clear to auscultation bilaterally.   No wheezes, crackles, or rhonchi.  Heart: Normal S1, S2. No MRG. Regular rate and rhythm. No peripheral edema, cyanosis or pallor.  Abdomen:  Soft, nondistended, nontender. No rebound or guarding. Normal bowel sounds. No appreciable masses or hepatomegaly. Rectal:  Not performed.  Msk:  Symmetrical without gross deformities. Peripheral pulses intact.  Extremities:  Without edema, no deformity or joint abnormality. Normal ROM, normal sensation. Neurologic:  Alert and  oriented x4;  grossly normal neurologically.  Skin:   Dry and intact without significant lesions or rashes. Psychiatric: Demonstrates good judgement and reason without abnormal affect or behaviors.  LAB RESULTS: Recent Labs    02/23/22 1528 02/24/22 0305 02/24/22 0637  WBC 7.7 5.1  --   HGB 5.7* 6.3* 6.3*  HCT 20.9* 21.2* 21.0*  PLT 387 316  --    BMET Recent Labs    02/23/22 1528  NA 137  K 4.1  CL 108  CO2 20*  GLUCOSE 115*  BUN 15  CREATININE 1.21  CALCIUM 8.8*   LFT Recent Labs    02/23/22 1528  PROT 6.5  ALBUMIN 4.1  AST 17  ALT 19  ALKPHOS 41  BILITOT 1.1   PT/INR Recent Labs    02/23/22 1528  LABPROT 13.2  INR 1.0    Impression / Plan:   Impression: 1.  GI bleed with acute blood loss anemia: Describes 1 episode of a large amount of bright red blood with a bowel movement yesterday morning 02/23/2022, hemoglobin 5.7--> 1 unit PRBC--> 6.3--> 1 unit  PRBCs ordered 2.  Mild cognitive impairment  Plan: 1.  Scheduled patient for an EGD and colonoscopy tomorrow given anemia.  This is scheduled with Dr. Lorenso Courier.  Did discuss risks, benefits, limitations and alternatives and patient agrees to proceed. 2.  Patient to remain on clear liquid diet today and n.p.o. at midnight 3.  Continue to monitor hemoglobin with transfusion as needed less than 7 4.  Patient will start movi prep in split dose fashion later this afternoon.  Thank you for your kind consultation, we will continue to follow.  Anderson Malta  Tasia Catchings  02/24/2022, 9:32 AM

## 2022-02-25 ENCOUNTER — Inpatient Hospital Stay (HOSPITAL_COMMUNITY): Payer: Medicare Other | Admitting: Certified Registered Nurse Anesthetist

## 2022-02-25 ENCOUNTER — Encounter (HOSPITAL_COMMUNITY): Payer: Self-pay | Admitting: Internal Medicine

## 2022-02-25 ENCOUNTER — Encounter (HOSPITAL_COMMUNITY)
Admission: EM | Disposition: A | Discharge status: Home / Self Care | Payer: Self-pay | Source: Home / Self Care | Attending: Internal Medicine

## 2022-02-25 DIAGNOSIS — K5520 Angiodysplasia of colon without hemorrhage: Secondary | ICD-10-CM

## 2022-02-25 DIAGNOSIS — K922 Gastrointestinal hemorrhage, unspecified: Secondary | ICD-10-CM | POA: Diagnosis not present

## 2022-02-25 DIAGNOSIS — D120 Benign neoplasm of cecum: Secondary | ICD-10-CM

## 2022-02-25 DIAGNOSIS — K449 Diaphragmatic hernia without obstruction or gangrene: Secondary | ICD-10-CM

## 2022-02-25 DIAGNOSIS — K648 Other hemorrhoids: Secondary | ICD-10-CM

## 2022-02-25 DIAGNOSIS — D490 Neoplasm of unspecified behavior of digestive system: Secondary | ICD-10-CM

## 2022-02-25 DIAGNOSIS — K2289 Other specified disease of esophagus: Secondary | ICD-10-CM

## 2022-02-25 HISTORY — PX: POLYPECTOMY: SHX5525

## 2022-02-25 HISTORY — PX: SCLEROTHERAPY: SHX6841

## 2022-02-25 HISTORY — PX: HEMOSTASIS CLIP PLACEMENT: SHX6857

## 2022-02-25 HISTORY — PX: COLONOSCOPY WITH PROPOFOL: SHX5780

## 2022-02-25 HISTORY — PX: SUBMUCOSAL TATTOO INJECTION: SHX6856

## 2022-02-25 HISTORY — PX: BIOPSY: SHX5522

## 2022-02-25 HISTORY — PX: ESOPHAGOGASTRODUODENOSCOPY (EGD) WITH PROPOFOL: SHX5813

## 2022-02-25 LAB — HEMOGLOBIN AND HEMATOCRIT, BLOOD
HCT: 25.3 % — ABNORMAL LOW (ref 39.0–52.0)
Hemoglobin: 7.3 g/dL — ABNORMAL LOW (ref 13.0–17.0)

## 2022-02-25 SURGERY — ESOPHAGOGASTRODUODENOSCOPY (EGD) WITH PROPOFOL
Anesthesia: Monitor Anesthesia Care

## 2022-02-25 MED ORDER — LACTATED RINGERS IV SOLN: INTRAVENOUS | Status: DC

## 2022-02-25 MED ORDER — SODIUM CHLORIDE 0.9 % IV SOLN: INTRAVENOUS | Status: DC

## 2022-02-25 MED ORDER — LIDOCAINE 2% (20 MG/ML) 5 ML SYRINGE
INTRAMUSCULAR | Status: DC | PRN
  Administered 2022-02-25: 60 mg via INTRAVENOUS
  Administered 2022-02-25: 20 mg via INTRAVENOUS

## 2022-02-25 MED ORDER — SPOT INK MARKER SYRINGE KIT
PACK | SUBMUCOSAL | Status: DC | PRN
  Administered 2022-02-25: 1.5 mL via SUBMUCOSAL

## 2022-02-25 MED ORDER — PROPOFOL 500 MG/50ML IV EMUL
INTRAVENOUS | Status: DC | PRN
  Administered 2022-02-25: 125 ug/kg/min via INTRAVENOUS

## 2022-02-25 MED ORDER — SPOT INK MARKER SYRINGE KIT
PACK | SUBMUCOSAL | Status: AC
  Filled 2022-02-25: qty 5

## 2022-02-25 MED ORDER — PROPOFOL 10 MG/ML IV BOLUS
INTRAVENOUS | Status: DC | PRN
  Administered 2022-02-25: 15 mg via INTRAVENOUS
  Administered 2022-02-25 (×3): 10 mg via INTRAVENOUS

## 2022-02-25 SURGICAL SUPPLY — 25 items

## 2022-02-25 NOTE — Op Note (Signed)
[facility] Patient Name: David Mahoney Procedure Date : 02/25/2022 MRN: [id number] Attending MD: Georgian Co ,  Date of Birth: 1945/10/22 CSN: [id number] Age: 76 Admit Type: Inpatient Procedure:                Upper GI endoscopy Indications:              Anemia Providers:                Adline Mango" Georga Hacking, RN, [facility] Technician, Technician Referring MD:              Medicines:                Monitored Anesthesia Care Complications:            No immediate complications. Estimated Blood Loss:     Estimated blood loss was minimal. Procedure:                Pre-Anesthesia Assessment:                           - Prior to the procedure, a History and Physical                            was performed, and patient medications and                            allergies were reviewed. The patient's tolerance of                            previous anesthesia was also reviewed. The risks                            and benefits of the procedure and the sedation                            options and risks were discussed with the patient.                            All questions were answered, and informed consent                            was obtained. Prior Anticoagulants: The patient has                            taken no previous anticoagulant or antiplatelet                            agents. ASA Grade Assessment: II - A patient with                            mild systemic disease. After reviewing the risks  and benefits, the patient was deemed in                            satisfactory condition to undergo the procedure.                           After obtaining informed consent, the endoscope was                            passed under direct vision. Throughout the                            procedure, the patient's blood pressure, pulse, and                            oxygen saturations  were monitored continuously. The                            [id number]-[id number] ([id number] Olympus endoscope was introduced                            through the mouth, and advanced to the second part                            of duodenum. The upper GI endoscopy was                            accomplished without difficulty. The patient                            tolerated the procedure well. Scope In: Scope Out: Findings:      White nummular lesions were noted in the proximal esophagus. Biopsies       were taken with a cold forceps for histology.      A medium-sized hiatal hernia was present.      Biopsies were taken with a cold forceps on the greater curvature of the       gastric body, on the lesser curvature of the gastric body, at the       incisura, on the greater curvature of the gastric antrum and on the       lesser curvature of the gastric antrum for histology.      The examined duodenum was normal. Biopsies were taken with a cold       forceps for histology. Impression:               - White nummular lesions in esophageal mucosa.                            Biopsied.                           - Medium-sized hiatal hernia.                           - Normal examined duodenum. Biopsied.                           -  Biopsies were taken with a cold forceps for                            histology on the greater curvature of the gastric                            body, on the lesser curvature of the gastric body,                            at the incisura, on the greater curvature of the                            gastric antrum and on the lesser curvature of the                            gastric antrum. Recommendation:           - Await pathology results.                           - Perform a colonoscopy today. Procedure Code(s):        --- Professional ---                           [phone], Esophagogastroduodenoscopy, flexible,                            transoral; with biopsy, single or  multiple Diagnosis Code(s):        --- Professional ---                           K22.8, Other specified diseases of esophagus                           K44.9, Diaphragmatic hernia without obstruction or                            gangrene                           D64.9, Anemia, unspecified CPT copyright 2019 [employer]. All rights reserved. The codes documented in this report are preliminary and upon coder review may  be revised to meet current compliance requirements. [address]Christia Reading,  02/25/2022 11:38:24 AM Number of Addenda: 0

## 2022-02-25 NOTE — Discharge Summary (Signed)
Physician Discharge Summary  David DOMANGUE WOE:[id number] DOB: Feb 05, 1946 DOA: 02/23/2022  PCP: [name], MD  Admit date: 02/23/2022 Discharge date: 02/25/2022  Admitted From: Home Disposition:  Home  Discharge Condition:Stable CODE STATUS:FULL Diet recommendation: regular   Brief/Interim Summary: Patient is a 76 year old male with history of mild cognitive impairment who presented to the emergency department from home with complaints of rectal bleeding.  He has never done a colonoscopy recently in the past.  He reported bright red blood pouring into the toilet bowl, 1 episode.  On presentation, his hemoglobin was in the range of 5.  Patient was transfused with 3 units of PRBC, repeat hemoglobin in the range of 7 and it has remained stable.  He underwent EGD/colonoscopy today with finding of large mass in the ascending colon which was biopsied.  GI cleared him for discharge today.  Hemodynamically stable for discharge.  He will be followed by GI as an outpatient  Following problems were addressed during his hospitalization:  Lower GI bleed: Unclear etiology.   Presented with frank passes of bright red blood into the toilet bowl, 1 episode.    GI consulted Underwent EGD/colonoscopy with finding of frond-like/villous, polypoid and ulcerated non-obstructing large mass in the ascending colon, in the cecum and at the ileocecal valve.  Biopsy collected.  GI will approach him with biopsy report. Aspirin will be held  Acute blood loss anemia: Presented with hemoglobin in the range of 5.  Transfused with 3 units of PRBC Hemoglobin currently stable in the range of 7.  We have recommended to follow-up with PCP and check CBC in a week   Mild cognitive impairment: Currently at baseline.       Discharge Diagnoses:  Principal Problem:   Lower GI bleed    Discharge Instructions  Discharge Instructions     Diet general   Complete by: As directed    Discharge instructions   Complete by: As  directed    1)Follow up with your PCP in a week.  During the follow-up, do a CBC test to check your hemoglobin 2)You will be called by gastroenterology about the biopsy report 3)We are holding the aspirin for now   Increase activity slowly   Complete by: As directed       Allergies as of 02/25/2022   No Known Allergies      Medication List     STOP taking these medications    aspirin EC 81 MG tablet       TAKE these medications    CALCIUM PO Take 1 tablet by mouth daily.   LIPO-FLAVONOID PLUS PO Take 1 tablet by mouth daily.   OVER THE COUNTER MEDICATION Super beta prostate   pyridOXINE 100 MG tablet Commonly known as: VITAMIN B-6 Take 100 mg by mouth daily.   Saw Palmetto 450 MG Caps Take 1 capsule by mouth daily.   vitamin B-12 100 MCG tablet Commonly known as: CYANOCOBALAMIN Take 100 mcg by mouth daily.   zinc sulfate 220 (50 Zn) MG capsule Take 220 mg by mouth daily.        No Known Allergies  Consultations: GI   Procedures/Studies: No results found.    Subjective: Patient seen and examined at bedside this morning.  Hemodynamically stable for discharge after EGD/colonoscopy.  I called the daughter and discussed about the discharge plan  Discharge Exam: Vitals:   02/25/22 1250 02/25/22 1305  BP: 112/66 128/69  Pulse: (!) 59 (!) 53  Resp: 15 16  Temp:  97.6 F (36.4 C) (!) 97.5 F (36.4 C)  SpO2: 100% 100%   Vitals:   02/25/22 1104 02/25/22 1240 02/25/22 1250 02/25/22 1305  BP: 131/66 115/73 112/66 128/69  Pulse: 68 (!) 58 (!) 59 (!) 53  Resp: '16 17 15 16  '$ Temp: 97.7 F (36.5 C) 97.6 F (36.4 C) 97.6 F (36.4 C) (!) 97.5 F (36.4 C)  TempSrc: Temporal   Oral  SpO2:  100% 100% 100%  Weight: 74.8 kg     Height: '5\' 8"'$  (1.727 m)       General: Pt is alert, awake, not in acute distress Cardiovascular: RRR, S1/S2 +, no rubs, no gallops Respiratory: CTA bilaterally, no wheezing, no rhonchi Abdominal: Soft, NT, ND, bowel sounds  + Extremities: no edema, no cyanosis    The results of significant diagnostics from this hospitalization (including imaging, microbiology, ancillary and laboratory) are listed below for reference.     Microbiology: No results found for this or any previous visit (from the past 240 hour(s)).   Labs: BNP (last 3 results) No results for input(s): BNP in the last 8760 hours. Basic Metabolic Panel: Recent Labs  Lab 02/23/22 1528  NA 137  K 4.1  CL 108  CO2 20*  GLUCOSE 115*  BUN 15  CREATININE 1.21  CALCIUM 8.8*   Liver Function Tests: Recent Labs  Lab 02/23/22 1528  AST 17  ALT 19  ALKPHOS 41  BILITOT 1.1  PROT 6.5  ALBUMIN 4.1   No results for input(s): LIPASE, AMYLASE in the last 168 hours. No results for input(s): AMMONIA in the last 168 hours. CBC: Recent Labs  Lab 02/23/22 1528 02/24/22 0305 02/24/22 0637 02/24/22 1423 02/24/22 2157 02/25/22 0623  WBC 7.7 5.1  --   --   --   --   NEUTROABS 5.7  --   --   --   --   --   HGB 5.7* 6.3* 6.3* 7.7* 7.4* 7.3*  HCT 20.9* 21.2* 21.0* 25.0* 25.1* 25.3*  MCV 70.8* 70.9*  --   --   --   --   PLT 387 316  --   --   --   --    Cardiac Enzymes: No results for input(s): CKTOTAL, CKMB, CKMBINDEX, TROPONINI in the last 168 hours. BNP: Invalid input(s): POCBNP CBG: No results for input(s): GLUCAP in the last 168 hours. D-Dimer No results for input(s): DDIMER in the last 72 hours. Hgb A1c No results for input(s): HGBA1C in the last 72 hours. Lipid Profile No results for input(s): CHOL, HDL, LDLCALC, TRIG, CHOLHDL, LDLDIRECT in the last 72 hours. Thyroid function studies No results for input(s): TSH, T4TOTAL, T3FREE, THYROIDAB in the last 72 hours.  Invalid input(s): FREET3 Anemia work up Recent Labs    02/24/22 0305  FERRITIN 7*  TIBC 535*  IRON 199*  RETICCTPCT 1.2   Urinalysis    Component Value Date/Time   COLORURINE YELLOW 09/14/2008 1435   APPEARANCEUR CLEAR 09/14/2008 1435   LABSPEC 1.028  09/14/2008 1435   PHURINE 5.5 09/14/2008 1435   GLUCOSEU NEGATIVE 09/14/2008 1435   HGBUR NEGATIVE 09/14/2008 1435   BILIRUBINUR NEGATIVE 09/14/2008 1435   KETONESUR 15 (A) 09/14/2008 1435   PROTEINUR NEGATIVE 09/14/2008 1435   UROBILINOGEN 0.2 09/14/2008 1435   NITRITE NEGATIVE 09/14/2008 1435   LEUKOCYTESUR  09/14/2008 1435    NEGATIVE MICROSCOPIC NOT DONE ON URINES WITH NEGATIVE PROTEIN, BLOOD, LEUKOCYTES, NITRITE, OR GLUCOSE <1000 mg/dL.   Sepsis Labs Invalid input(s): PROCALCITONIN,  WBC,  LACTICIDVEN Microbiology No results found for this or any previous visit (from the past 240 hour(s)).  Please note: You were cared for by a hospitalist during your hospital stay. Once you are discharged, your primary care physician will handle any further medical issues. Please note that NO REFILLS for any discharge medications will be authorized once you are discharged, as it is imperative that you return to your primary care physician (or establish a relationship with a primary care physician if you do not have one) for your post hospital discharge needs so that they can reassess your need for medications and monitor your lab values.    Time coordinating discharge: 40 minutes  SIGNED:   Shelly Coss, MD  [facility] 02/25/2022, 2:02 PM Pager [phone]  If 7PM-7AM, please contact night-coverage [email or web address] Password [email or web address]

## 2022-02-25 NOTE — Op Note (Addendum)
[facility] Patient Name: David Mahoney Procedure Date : 02/25/2022 MRN: [id number] Attending MD: Georgian Co ,  Date of Birth: 03-08-1946 CSN: [id number] Age: 76 Admit Type: Inpatient Procedure:                Colonoscopy Indications:              Anemia Providers:                Adline Mango" Georga Hacking, RN, [facility] Technician, Technician Referring MD:              Medicines:                Monitored Anesthesia Care Complications:            No immediate complications. Estimated Blood Loss:     Estimated blood loss was minimal. Procedure:                Pre-Anesthesia Assessment:                           - Prior to the procedure, a History and Physical                            was performed, and patient medications and                            allergies were reviewed. The patient's tolerance of                            previous anesthesia was also reviewed. The risks                            and benefits of the procedure and the sedation                            options and risks were discussed with the patient.                            All questions were answered, and informed consent                            was obtained. Prior Anticoagulants: The patient has                            taken no previous anticoagulant or antiplatelet                            agents. ASA Grade Assessment: II - A patient with                            mild systemic disease. After reviewing the risks  and benefits, the patient was deemed in                            satisfactory condition to undergo the procedure.                           After obtaining informed consent, the colonoscope                            was passed under direct vision. Throughout the                            procedure, the patient's blood pressure, pulse, and                            oxygen saturations were  monitored continuously. The                            [id number]-HQ190L ([id number]) Olympus peds colonscope was                            introduced through the anus and advanced to the the                            terminal ileum. The colonoscopy was performed                            without difficulty. The patient tolerated the                            procedure well. The quality of the bowel                            preparation was good. The terminal ileum, ileocecal                            valve, appendiceal orifice, and rectum were                            photographed. Scope In: 11:40:34 AM Scope Out: 12:28:17 PM Scope Withdrawal Time: 0 hours 43 minutes 48 seconds  Total Procedure Duration: 0 hours 47 minutes 43 seconds  Findings:      The terminal ileum appeared normal.      A single localized angioectasia without bleeding was found in the cecum.      A 20 mm polyp was found in the cecum. The polyp was sessile.       Preparations were made for mucosal resection. Eleview was injected to       raise the lesion. Snare mucosal resection was performed. Resection and       retrieval were complete. Snare tip cautery was administered to the edges       of the resection site. To close a defect after mucosal resection, three       hemostatic clips were successfully placed. There was no bleeding at the  end of the procedure.      A frond-like/villous, polypoid and ulcerated non-obstructing       medium-sized mass was found in the ascending colon, in the cecum and at       the ileocecal valve. The mass was partially circumferential (involving       one-third of the lumen circumference). The mass measured one cm in       length. In addition, its diameter measured fifteen mm. No bleeding was       present. Biopsies were taken with a cold forceps for histology. Area       distal to and on the opposite wall of the mass was tattooed with an       injection of [place] ink.      Multiple  small and large-mouthed diverticula were found in the sigmoid       colon, descending colon, transverse colon and ascending colon.      Non-bleeding internal hemorrhoids were found during retroflexion. Impression:               - The examined portion of the ileum was normal.                           - A single non-bleeding colonic angioectasia.                           - One 20 mm polyp in the cecum, removed with                            mucosal resection. Resected and retrieved. Clips                            were placed.                           - Rule out malignancy, tumor in the ascending                            colon, in the cecum and at the ileocecal valve.                            Biopsied. Tattooed.                           - Diverticulosis in the sigmoid colon, in the                            descending colon, in the transverse colon and in                            the ascending colon.                           - Non-bleeding internal hemorrhoids.                           - Mucosal resection was performed. Resection and  retrieval were complete. Recommendation:           - Return patient to hospital ward for ongoing care.                           - It is suspected that your anemia may be due to                            the mass in the ascending colon.                           - Await pathology results.                           - The findings and recommendations were discussed                            with the patient. Procedure Code(s):        --- Professional ---                           [phone], Colonoscopy, flexible; with endoscopic                            mucosal resection                           45380, 27, Colonoscopy, flexible; with biopsy,                            single or multiple Diagnosis Code(s):        --- Professional ---                           K64.8, Other hemorrhoids                           K55.20,  Angiodysplasia of colon without hemorrhage                           K63.5, Polyp of colon                           D49.0, Neoplasm of unspecified behavior of                            digestive system                           D64.9, Anemia, unspecified                           K57.30, Diverticulosis of large intestine without                            perforation or abscess without bleeding CPT copyright 2019 American Medical Association. All rights reserved. The codes documented in this report are preliminary and upon coder review may  be revised to meet current compliance requirements. Sonny Masters "Christia Reading,  02/25/2022 12:51:05 PM Number of Addenda: 0

## 2022-02-25 NOTE — Anesthesia Postprocedure Evaluation (Signed)
Anesthesia Post Note  Patient: David Mahoney  Procedure(s) Performed: ESOPHAGOGASTRODUODENOSCOPY (EGD) WITH PROPOFOL COLONOSCOPY WITH PROPOFOL BIOPSY SCLEROTHERAPY POLYPECTOMY HEMOSTASIS CLIP PLACEMENT SUBMUCOSAL TATTOO INJECTION     Patient location during evaluation: PACU Anesthesia Type: MAC Level of consciousness: awake and alert Pain management: pain level controlled Vital Signs Assessment: post-procedure vital signs reviewed and stable Respiratory status: spontaneous breathing, nonlabored ventilation and respiratory function stable Cardiovascular status: stable and blood pressure returned to baseline Postop Assessment: no apparent nausea or vomiting Anesthetic complications: no   No notable events documented.  Last Vitals:  Vitals:   02/25/22 1250 02/25/22 1305  BP: 112/66 128/69  Pulse: (!) 59 (!) 53  Resp: 15 16  Temp: 36.4 C (!) 36.4 C  SpO2: 100% 100%    Last Pain:  Vitals:   02/25/22 1305  TempSrc: Oral  PainSc:                  Mitsuo Budnick,W. EDMOND

## 2022-02-25 NOTE — Anesthesia Preprocedure Evaluation (Addendum)
Anesthesia Evaluation  Patient identified by MRN, date of birth, ID band Patient awake    Reviewed: Allergy & Precautions, H&P , NPO status , Patient's Chart, lab work & pertinent test results  Airway Mallampati: II  TM Distance: >3 FB Neck ROM: Full    Dental no notable dental hx. (+) Poor Dentition, Dental Advisory Given   Pulmonary neg pulmonary ROS,    Pulmonary exam normal breath sounds clear to auscultation       Cardiovascular negative cardio ROS   Rhythm:Regular Rate:Normal     Neuro/Psych negative neurological ROS  negative psych ROS   GI/Hepatic negative GI ROS, Neg liver ROS,   Endo/Other  negative endocrine ROS  Renal/GU negative Renal ROS  negative genitourinary   Musculoskeletal   Abdominal   Peds  Hematology  (+) Blood dyscrasia, anemia ,   Anesthesia Other Findings   Reproductive/Obstetrics negative OB ROS                            Anesthesia Physical Anesthesia Plan  ASA: 2  Anesthesia Plan: MAC   Post-op Pain Management: Minimal or no pain anticipated   Induction: Intravenous  PONV Risk Score and Plan: 1 and Propofol infusion  Airway Management Planned: Natural Airway and Nasal Cannula  Additional Equipment:   Intra-op Plan:   Post-operative Plan:   Informed Consent: I have reviewed the patients History and Physical, chart, labs and discussed the procedure including the risks, benefits and alternatives for the proposed anesthesia with the patient or authorized representative who has indicated his/her understanding and acceptance.     Dental advisory given  Plan Discussed with: CRNA  Anesthesia Plan Comments:         Anesthesia Quick Evaluation

## 2022-02-25 NOTE — Anesthesia Procedure Notes (Signed)
Procedure Name: MAC Date/Time: 02/25/2022 11:14 AM Performed by: Janene Harvey, CRNA Pre-anesthesia Checklist: Patient identified, Emergency Drugs available, Suction available and Patient being monitored Patient Re-evaluated:Patient Re-evaluated prior to induction Oxygen Delivery Method: Nasal cannula Induction Type: IV induction Placement Confirmation: positive ETCO2 Dental Injury: Teeth and Oropharynx as per pre-operative assessment

## 2022-02-25 NOTE — Transfer of Care (Signed)
Immediate Anesthesia Transfer of Care Note  Patient: David Mahoney  Procedure(s) Performed: ESOPHAGOGASTRODUODENOSCOPY (EGD) WITH PROPOFOL COLONOSCOPY WITH PROPOFOL BIOPSY SCLEROTHERAPY POLYPECTOMY HEMOSTASIS CLIP PLACEMENT  Patient Location: PACU  Anesthesia Type:General  Level of Consciousness: awake  Airway & Oxygen Therapy: Patient Spontanous Breathing  Post-op Assessment: Report given to RN and Post -op Vital signs reviewed and stable  Post vital signs: Reviewed and stable  Last Vitals:  Vitals Value Taken Time  BP 115/73 02/25/22 1240  Temp    Pulse 64 02/25/22 1241  Resp 20 02/25/22 1241  SpO2 95 % 02/25/22 1241  Vitals shown include unvalidated device data.  Last Pain:  Vitals:   02/25/22 1104  TempSrc: Temporal  PainSc: 0-No pain         Complications: No notable events documented.

## 2022-02-25 NOTE — Interval H&P Note (Deleted)
History and Physical Interval Note:  02/25/2022 11:21 AM  Michele Mcalpine  has presented today for surgery, with the diagnosis of Anemia.  The various methods of treatment have been discussed with the patient and family. After consideration of risks, benefits and other options for treatment, the patient has consented to  Procedure(s): ESOPHAGOGASTRODUODENOSCOPY (EGD) WITH PROPOFOL (N/A) COLONOSCOPY WITH PROPOFOL (N/A) as a surgical intervention.  The patient's history has been reviewed, patient examined, no change in status, stable for surgery.  I have reviewed the patient's chart and labs.  Questions were answered to the patient's satisfaction.     Sharyn Creamer

## 2022-02-25 NOTE — Progress Notes (Signed)
Pt received back from PACU post procedure, alert and oriented x 4.

## 2022-02-25 NOTE — Interval H&P Note (Signed)
History and Physical Interval Note:  02/25/2022 11:05 AM  David Mahoney  has presented today for surgery, with the diagnosis of Anemia.  The various methods of treatment have been discussed with the patient and family. After consideration of risks, benefits and other options for treatment, the patient has consented to  Procedure(s): ESOPHAGOGASTRODUODENOSCOPY (EGD) WITH PROPOFOL (N/A) COLONOSCOPY WITH PROPOFOL (N/A) as a surgical intervention.  The patient's history has been reviewed, patient examined, no change in status, stable for surgery.  I have reviewed the patient's chart and labs.  Questions were answered to the patient's satisfaction.     Sharyn Creamer

## 2022-02-26 ENCOUNTER — Encounter (HOSPITAL_COMMUNITY): Payer: Self-pay | Admitting: Internal Medicine

## 2022-02-26 LAB — TYPE AND SCREEN
ABO/RH(D): A NEG
Antibody Screen: NEGATIVE
Unit division: 0
Unit division: 0
Unit division: 0
Unit division: 0

## 2022-02-26 LAB — BPAM RBC
Blood Product Expiration Date: 202305262359
Blood Product Expiration Date: 202306012359
Blood Product Expiration Date: 202306142359
Blood Product Expiration Date: 202306142359
ISSUE DATE / TIME: 202305191806
ISSUE DATE / TIME: 202305192234
ISSUE DATE / TIME: 202305200922
Unit Type and Rh: 600
Unit Type and Rh: 600
Unit Type and Rh: 600
Unit Type and Rh: 600

## 2022-02-27 ENCOUNTER — Telehealth: Payer: Self-pay | Admitting: Internal Medicine

## 2022-02-27 ENCOUNTER — Other Ambulatory Visit: Payer: Self-pay

## 2022-02-27 DIAGNOSIS — C182 Malignant neoplasm of ascending colon: Secondary | ICD-10-CM

## 2022-02-27 DIAGNOSIS — K6389 Other specified diseases of intestine: Secondary | ICD-10-CM

## 2022-02-27 LAB — SURGICAL PATHOLOGY

## 2022-02-27 NOTE — Telephone Encounter (Signed)
Orders placed per Dr. Libby Maw request. Message sent to Radiology Central Scheduling for scheduling purposes. Reminder created to ensure pt has been scheduled in a timely manner. Referral remains on hold per family's request.

## 2022-02-27 NOTE — Telephone Encounter (Signed)
Attempted to call the patient, but both attempts went straight to voicemail. I then called his daughter Inocencio Roy who is HCPOA for the patient. 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  We will plan for further evaluation by performing a CT chest and CT A/P w/contrast for staging purposes.  Patient's daughter stated that she would like to do some research into which physicians would be good options for colorectal surgery and medical oncology. Currently she favors having her father seen at [facility] for his care. She stated that she would get back to me in the next 1 to 2 days with where she would like referrals sent to.   Ammie, let's order CT chest and CT A/P to be done urgently, later this week if possible. Thanks.

## 2022-02-28 ENCOUNTER — Telehealth: Payer: Self-pay | Admitting: Internal Medicine

## 2022-02-28 ENCOUNTER — Other Ambulatory Visit: Payer: Self-pay

## 2022-02-28 ENCOUNTER — Ambulatory Visit
Admission: RE | Admit: 2022-02-28 | Discharge: 2022-02-28 | Disposition: A | Discharge status: Home / Self Care | Payer: Medicare Other | Source: Ambulatory Visit | Attending: Internal Medicine | Admitting: Internal Medicine

## 2022-02-28 DIAGNOSIS — K8020 Calculus of gallbladder without cholecystitis without obstruction: Secondary | ICD-10-CM | POA: Diagnosis not present

## 2022-02-28 DIAGNOSIS — C799 Secondary malignant neoplasm of unspecified site: Secondary | ICD-10-CM | POA: Diagnosis not present

## 2022-02-28 DIAGNOSIS — K6389 Other specified diseases of intestine: Secondary | ICD-10-CM

## 2022-02-28 DIAGNOSIS — K769 Liver disease, unspecified: Secondary | ICD-10-CM | POA: Diagnosis not present

## 2022-02-28 DIAGNOSIS — C182 Malignant neoplasm of ascending colon: Secondary | ICD-10-CM

## 2022-02-28 DIAGNOSIS — I700 Atherosclerosis of aorta: Secondary | ICD-10-CM | POA: Diagnosis not present

## 2022-02-28 DIAGNOSIS — I2510 Atherosclerotic heart disease of native coronary artery without angina pectoris: Secondary | ICD-10-CM | POA: Diagnosis not present

## 2022-02-28 DIAGNOSIS — N433 Hydrocele, unspecified: Secondary | ICD-10-CM | POA: Diagnosis not present

## 2022-02-28 DIAGNOSIS — K639 Disease of intestine, unspecified: Secondary | ICD-10-CM | POA: Diagnosis not present

## 2022-02-28 MED ORDER — IOPAMIDOL (ISOVUE-300) INJECTION 61%
100.0000 mL | Freq: Once | INTRAVENOUS | Status: AC | PRN
  Administered 2022-02-28: 100 mL via INTRAVENOUS

## 2022-02-28 NOTE — Telephone Encounter (Signed)
Orders previously placed for CEA. Orders have now been placed for CBC and med onc referral. Given that it's the end of the business day, will complete referral form for [facility] tomorrow morning (5/25) as well as fax all pertinent records to support need for referral. Attempted to call [phone] but was unable to reach pt. VM not set up. Will continue efforts to reach pt. Reminder also created to ensure labs have been completed and received in a timely manner.

## 2022-02-28 NOTE — Telephone Encounter (Signed)
Spoke with the patient's daughter about the results of the CT C/A/P. She had already been updated about the CT results by radiologist Dr. Polly Cobia. Since Dr. Johney [place] has requested a CEA, we will order that.   Ammie, please order a CEA and CBC, and call the patient's daughter to let her know where the patient needs to go in order to drop off the labs. Also please place a referral to medical oncology here at [facility] and another referral to medical oncology at [facility]. Patient's daughter has not fully decided where he will follow up yet.

## 2022-03-01 ENCOUNTER — Encounter: Payer: Self-pay | Admitting: *Deleted

## 2022-03-01 ENCOUNTER — Other Ambulatory Visit (INDEPENDENT_AMBULATORY_CARE_PROVIDER_SITE_OTHER): Payer: Medicare Other

## 2022-03-01 ENCOUNTER — Other Ambulatory Visit: Payer: Self-pay

## 2022-03-01 DIAGNOSIS — C182 Malignant neoplasm of ascending colon: Secondary | ICD-10-CM

## 2022-03-01 LAB — CBC
HCT: 26.1 % — ABNORMAL LOW (ref 39.0–52.0)
Hemoglobin: 8 g/dL — CL (ref 13.0–17.0)
MCHC: 30.6 g/dL (ref 30.0–36.0)
MCV: 71 fl — ABNORMAL LOW (ref 78.0–100.0)
Platelets: 327 10*3/uL (ref 150.0–400.0)
RBC: 3.68 Mil/uL — ABNORMAL LOW (ref 4.22–5.81)
RDW: 22.5 % — ABNORMAL HIGH (ref 11.5–15.5)
WBC: 7.2 10*3/uL (ref 4.0–10.5)

## 2022-03-01 NOTE — Progress Notes (Signed)
PATIENT NAVIGATOR PROGRESS NOTE  Name: David Mahoney Date: 03/01/2022 MRN: [id number]  DOB: 1946/07/17   Reason for visit:  Introductory phone call with daughter  Comments:  Spoke with Ms Cuttino with New Patient appt for May 30 at 3:30. Directions to building and parking reviewed as well as one support person and mask policy.  Verbalized understanding    Time spent counseling/coordinating care: 30-45 minutes

## 2022-03-01 NOTE — Telephone Encounter (Signed)
SECOND ATTEMPT:  Called pt contact #. VM full and unable to LVM. Referral and all records faxed to [facility] @ [phone]. Confirmation received. Nurse Navigator [phone], office [phone]. Efforts continue to reach pt re: need for labs.

## 2022-03-01 NOTE — Telephone Encounter (Signed)
Called dtr, David Mahoney. Advised about need for completion of labs. States she intends to take her father today to complete labs. Confirmed she received a call from [place]. Pt is scheduled for NP appt 03/06/22. Provided her with [facility] contact # [phone] as well as Nurse [place] contact # [phone] and advised she follow up on the status of referral. Expressed appreciation and understanding.

## 2022-03-02 LAB — CEA: CEA: 2.4 ng/mL

## 2022-03-06 ENCOUNTER — Encounter: Payer: Self-pay | Admitting: Surgery

## 2022-03-06 ENCOUNTER — Inpatient Hospital Stay: Payer: Medicare Other | Attending: Oncology | Admitting: Oncology

## 2022-03-06 ENCOUNTER — Ambulatory Visit: Payer: Self-pay | Admitting: Surgery

## 2022-03-06 ENCOUNTER — Encounter: Payer: Self-pay | Admitting: *Deleted

## 2022-03-06 VITALS — BP 130/79 | HR 79 | Temp 98.1°F | Resp 18 | Ht 69.0 in | Wt 177.0 lb

## 2022-03-06 DIAGNOSIS — K921 Melena: Secondary | ICD-10-CM | POA: Insufficient documentation

## 2022-03-06 DIAGNOSIS — D509 Iron deficiency anemia, unspecified: Secondary | ICD-10-CM | POA: Insufficient documentation

## 2022-03-06 DIAGNOSIS — I2510 Atherosclerotic heart disease of native coronary artery without angina pectoris: Secondary | ICD-10-CM | POA: Insufficient documentation

## 2022-03-06 DIAGNOSIS — G3184 Mild cognitive impairment, so stated: Secondary | ICD-10-CM | POA: Diagnosis not present

## 2022-03-06 DIAGNOSIS — R739 Hyperglycemia, unspecified: Secondary | ICD-10-CM

## 2022-03-06 DIAGNOSIS — R160 Hepatomegaly, not elsewhere classified: Secondary | ICD-10-CM | POA: Insufficient documentation

## 2022-03-06 DIAGNOSIS — H9190 Unspecified hearing loss, unspecified ear: Secondary | ICD-10-CM | POA: Insufficient documentation

## 2022-03-06 DIAGNOSIS — K922 Gastrointestinal hemorrhage, unspecified: Secondary | ICD-10-CM | POA: Diagnosis not present

## 2022-03-06 DIAGNOSIS — N401 Enlarged prostate with lower urinary tract symptoms: Secondary | ICD-10-CM | POA: Insufficient documentation

## 2022-03-06 DIAGNOSIS — K8020 Calculus of gallbladder without cholecystitis without obstruction: Secondary | ICD-10-CM | POA: Insufficient documentation

## 2022-03-06 DIAGNOSIS — C182 Malignant neoplasm of ascending colon: Secondary | ICD-10-CM | POA: Insufficient documentation

## 2022-03-06 NOTE — Progress Notes (Signed)
David Mahoney Consult   Requesting MD: Sharyn Creamer, Md [address] [place],  [place] [address]   David Mahoney 76 y.o.  24-Mar-1946    Reason for Consult: Colon cancer   HPI: [name] had an episode of rectal bleeding earlier this month.  He saw his primary provider and the hemoglobin returned at 5.5.  He was referred to the emergency room on 02/23/2022.  Rectal exam showed Hemoccult positive stool.  A CBC found the hemoglobin at 5.7, hematocrit 20.9%, MCV 70.8, platelets 307,000, and WBC 7.7 with an ANC of 5.7.  He was admitted and transfused with 3 units of packed red blood cells.  The hemoglobin returned at 7.3 on 02/25/2022.  He is taking over-the-counter iron.  He was referred to Dr. Lorenso Courier and was taken to an upper endoscopy and colonoscopy on 02/25/2022.  Biopsies from the stomach, esophagus, and duodenum were negative for malignancy.  A 20 mm polyp was removed from the cecum.  A mass was found in the ascending colon, in the cecum, and at the ileocecal valve.  Biopsies were obtained.  An area distal to the mass was tattooed.  Diverticula were noted.  The pathology from the cecum polyp revealed a tubular adenoma.  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  CTs of the chest, abdomen, and pelvis on 02/28/2022 revealed no lung masses or significant pulmonary nodules.  A 0.5 cm inferior right liver lesion is too small to characteriz and measured 0.3 cm on 09/14/2008-likely benign.  Enlarged prostate.  Bladder stones.  There is an ill-defined cecal/ascending colon mass abutting the ileocecal valve.  A 0.5 cm right ileocolic mesenteric node.  No pathologic enlarged lymph nodes.  Prostamegaly.  He was referred to Dr. Johney Maine and is being scheduled for surgery.    Past Medical History:  Diagnosis Date   BPH (benign prostatic hyperplasia)    Per daughter   Hyperlipidemia     .  Cognitive  impairment   .  Hearing loss  Past Surgical History:  Procedure Laterality Date   BIOPSY  02/25/2022   Procedure: BIOPSY;  Surgeon: Sharyn Creamer, MD;  Location: [facility] ENDOSCOPY;  Service: Gastroenterology;;   COLONOSCOPY WITH PROPOFOL N/A 02/25/2022   Procedure: COLONOSCOPY WITH PROPOFOL;  Surgeon: Sharyn Creamer, MD;  Location: [place];  Service: Gastroenterology;  Laterality: N/A;   ESOPHAGOGASTRODUODENOSCOPY (EGD) WITH PROPOFOL N/A 02/25/2022   Procedure: ESOPHAGOGASTRODUODENOSCOPY (EGD) WITH PROPOFOL;  Surgeon: Sharyn Creamer, MD;  Location: [place];  Service: Gastroenterology;  Laterality: N/A;   HEMOSTASIS CLIP PLACEMENT  02/25/2022   Procedure: HEMOSTASIS CLIP PLACEMENT;  Surgeon: Sharyn Creamer, MD;  Location: [place];  Service: Gastroenterology;;   POLYPECTOMY  02/25/2022   Procedure: POLYPECTOMY;  Surgeon: Sharyn Creamer, MD;  Location: [place];  Service: Gastroenterology;;   David Mahoney  02/25/2022   Procedure: David Mahoney;  Surgeon: Sharyn Creamer, MD;  Location: [place];  Service: Gastroenterology;;   SUBMUCOSAL TATTOO INJECTION  02/25/2022   Procedure: SUBMUCOSAL TATTOO INJECTION;  Surgeon: Sharyn Creamer, MD;  Location: [place];  Service: Gastroenterology;;   VASECTOMY      .  Cataract surgery  Medications: Reviewed  Allergies: No Known Allergies  Family history: No family history of cancer   Social History:   He lives alone in [place].  His daughter is a Stage manager in [place] and lives nearby.  He does not use cigarettes.  He has 2  glasses of wine each night.  He has received COVID-19 and influenza vaccines.    ROS:   Positives include: 1 episode of rectal bleeding May 2023, decreased urinary stream  A complete ROS was otherwise negative.  Physical Exam:  Blood pressure 130/79, pulse 79, temperature 98.1 F (36.7 C), temperature source Oral, resp. rate 18, height 5' 9"  (1.753 m), weight 177 lb (80.3 kg), SpO2 98 %.  HEENT:  Multiple missing teeth, oropharynx without visible mass, neck without mass Lungs: Clear bilaterally Cardiac: Regular rate and rhythm Abdomen: No hepatosplenomegaly, no mass, nontender  Vascular: No leg edema Lymph nodes: No cervical, supraclavicular, axillary, or inguinal nodes Neurologic: Alert, follows commands, the motor exam appears intact in the upper and lower extremities bilaterally, difficulty with memory recall Skin: No rash, multiple benign-appearing moles over the trunk Musculoskeletal: No spine tenderness   LAB:  CBC  Lab Results  Component Value Date   WBC 7.2 03/01/2022   HGB 8.0 Repeated and verified X2. (LL) 03/01/2022   HCT 26.1 (L) 03/01/2022   MCV 71.0 (L) 03/01/2022   PLT 327.0 03/01/2022   NEUTROABS 5.7 02/23/2022        CMP  Lab Results  Component Value Date   NA 137 02/23/2022   K 4.1 02/23/2022   CL 108 02/23/2022   CO2 20 (L) 02/23/2022   GLUCOSE 115 (H) 02/23/2022   BUN 15 02/23/2022   CREATININE 1.21 02/23/2022   CALCIUM 8.8 (L) 02/23/2022   PROT 6.5 02/23/2022   ALBUMIN 4.1 02/23/2022   AST 17 02/23/2022   ALT 19 02/23/2022   ALKPHOS 41 02/23/2022   BILITOT 1.1 02/23/2022   GFRNONAA >60 02/23/2022   GFRAA  09/14/2008    >60        The eGFR has been calculated using the MDRD equation. This calculation has not been validated in all clinical   CEA on 03/01/2022-2.4  Imaging:  CT images from 02/28/2022 reviewed with Mr. Rodenberg and his daughter    Assessment/Plan:   Colon cancer-colonoscopy 02/25/2022-mass at the ascending colon/cecum-biopsy invasive moderately differentiated adenocarcinoma CTs 02/28/2022-cecal/ascending colon mass, 0.5 cm right ileocolic mesenteric node, no evidence of distant metastatic disease, 0.5 cm hypodense inferior right liver lesion-0.3 cm on a CT in 2009  Iron deficiency anemia secondary to #1 Cognitive impairment BPH Hearing loss Hyperlipidemia Tubular adenoma on the colonoscopy  02/25/2022   Disposition:   David Mahoney has been diagnosed with colon cancer.  He presented with rectal bleeding 02/23/2022.  He has iron deficiency anemia.  There is no clinical or radiologic evidence of metastatic disease.  I reviewed the staging evaluation to date with Mr. Rudy and his daughter.  We discussed treatment of colon cancer and indications for adjuvant systemic therapy.  I will see him after surgery to review details of the surgical pathology report and make recommendations for adjuvant treatment.  He most likely does not have hereditary nonpolyposis colon cancer syndrome, but his family members are at increased risk of developing colorectal cancer and should receive appropriate screening.  The resected tumor will be tested for mismatch repair protein expression and microsatellite instability.  He will return for an office visit approximately 2 weeks after surgery.  He will begin ferrous sulfate twice daily.  Betsy Coder, MD  03/06/2022, 4:53 PM

## 2022-03-06 NOTE — Progress Notes (Unsigned)
PATIENT NAVIGATOR PROGRESS NOTE  Name: David Mahoney Date: 03/06/2022 MRN: [id number]  DOB: 1946-01-02   Reason for visit:  Initial visit with Dr Benay Spice  Comments:  Met with Mr [name] and daughter [name] during visit with Dr Benay Spice  Surgery with Dr Johney Maine to be scheduled within next 2 weeks Dr Benay Spice will see two weeks after surgery Instructed to take Ferous Sulfate 362m BID with citrus to help in absorption. Contact information given to call with any issues or questions    Time spent counseling/coordinating care: > 60 minutes

## 2022-03-07 ENCOUNTER — Other Ambulatory Visit (HOSPITAL_COMMUNITY): Payer: Self-pay

## 2022-03-08 NOTE — Progress Notes (Signed)
DUE TO COVID-19 ONLY ONE VISITOR IS ALLOWED TO COME WITH YOU AND STAY IN THE WAITING ROOM ONLY DURING PRE OP AND PROCEDURE DAY OF SURGERY.  2 VISITOR  MAY VISIT WITH YOU AFTER SURGERY IN YOUR PRIVATE ROOM DURING VISITING HOURS ONLY! YOU MAY HAVE ONE PERSON SPEND THE NITE WITH YOU IN YOUR ROOM AFTER SURGERY.      Your procedure is scheduled on:         03/15/2022   Report to [facility] Main  Entrance   Report to admitting at    0715             AM DO NOT [place], PICTURE ID OR WALLET DAY OF SURGERY.      Call this number if you have problems the morning of surgery [phone]  Clear liquid diet the day before surgery.   Follow bowel prep instructions per MD    REMEMBER: NO  SOLID FOODS , CANDY, GUM OR MINTS AFTER [place] .     Complete 2 presurgery ensure drinks at 1000pm the nite before surgery.     Marland Kitchen CLEAR LIQUIDS UNTIL      0630am           DAY OF SURGERY.      PLEASE FINISH ENSURE DRINK PER SURGEON ORDER  WHICH NEEDS TO BE COMPLETED AT    0630am       MORNING OF SURGERY.       CLEAR LIQUID DIET   Foods Allowed      WATER BLACK COFFEE ( SUGAR OK, NO MILK, CREAM OR CREAMER) REGULAR AND DECAF  TEA ( SUGAR OK NO MILK, CREAM, OR CREAMER) REGULAR AND DECAF  PLAIN JELLO ( NO RED)  FRUIT ICES ( NO RED, NO FRUIT PULP)  POPSICLES ( NO RED)  JUICE- APPLE, WHITE GRAPE AND WHITE CRANBERRY  SPORT DRINK LIKE GATORADE ( NO RED)  CLEAR BROTH ( VEGETABLE , CHICKEN OR BEEF)                                                                     BRUSH YOUR TEETH MORNING OF SURGERY AND RINSE YOUR MOUTH OUT, NO CHEWING GUM CANDY OR MINTS.     Take these medicines the morning of surgery with A SIP OF WATER:  none    DO NOT TAKE ANY DIABETIC MEDICATIONS DAY OF YOUR SURGERY                               You may not have any metal on your body including hair pins and              piercings  Do not wear jewelry, make-up, lotions, powders or perfumes,  deodorant             Do not wear nail polish on your fingernails.              IF YOU ARE A MALE AND WANT TO SHAVE UNDER ARMS OR LEGS PRIOR TO SURGERY YOU MUST DO SO AT LEAST 48 HOURS PRIOR TO SURGERY.              Men  may shave face and neck.   Do not bring valuables to the hospital. [place].  Contacts, dentures or bridgework may not be worn into surgery.  Leave suitcase in the car. After surgery it may be brought to your room.     Patients discharged the day of surgery will not be allowed to drive home. IF YOU ARE HAVING SURGERY AND GOING HOME THE SAME DAY, YOU MUST HAVE AN ADULT TO DRIVE YOU HOME AND BE WITH YOU FOR 24 HOURS. YOU MAY GO HOME BY TAXI OR UBER OR ORTHERWISE, BUT AN ADULT MUST ACCOMPANY YOU HOME AND STAY WITH YOU FOR 24 HOURS.                Please read over the following fact sheets you were given: _____________________________________________________________________  [facility] - Preparing for Surgery Before surgery, you can play an important role.  Because skin is not sterile, your skin needs to be as free of germs as possible.  You can reduce the number of germs on your skin by washing with CHG (chlorahexidine gluconate) soap before surgery.  CHG is an antiseptic cleaner which kills germs and bonds with the skin to continue killing germs even after washing. Please DO NOT use if you have an allergy to CHG or antibacterial soaps.  If your skin becomes reddened/irritated stop using the CHG and inform your nurse when you arrive at Short Stay. Do not shave (including legs and underarms) for at least 48 hours prior to the first CHG shower.  You may shave your face/neck. Please follow these instructions carefully:  1.  Shower with CHG Soap the night before surgery and the  morning of Surgery.  2.  If you choose to wash your hair, wash your hair first as usual with your  normal  shampoo.  3.  After you shampoo, rinse your hair  and body thoroughly to remove the  shampoo.                           4.  Use CHG as you would any other liquid soap.  You can apply chg directly  to the skin and wash                       Gently with a scrungie or clean washcloth.  5.  Apply the CHG Soap to your body ONLY FROM THE NECK DOWN.   Do not use on face/ open                           Wound or open sores. Avoid contact with eyes, ears mouth and genitals (private parts).                       Wash face,  Genitals (private parts) with your normal soap.             6.  Wash thoroughly, paying special attention to the area where your surgery  will be performed.  7.  Thoroughly rinse your body with warm water from the neck down.  8.  DO NOT shower/wash with your normal soap after using and rinsing off  the CHG Soap.                9.  Pat yourself dry with a clean towel.            10.  Wear clean pajamas.            11.  Place clean sheets on your bed the night of your first shower and do not  sleep with pets. Day of Surgery : Do not apply any lotions/deodorants the morning of surgery.  Please wear clean clothes to the hospital/surgery center.  FAILURE TO FOLLOW THESE INSTRUCTIONS MAY RESULT IN THE CANCELLATION OF YOUR SURGERY PATIENT SIGNATURE_________________________________  NURSE SIGNATURE__________________________________  ________________________________________________________________________

## 2022-03-08 NOTE — Progress Notes (Addendum)
Anesthesia Review:  PCP: DR Creola Corn  Cardiologist : none  Chest x-ray : CT chest- 02/28/22  EKG : 02/23/2022  Echo : Stress test: Cardiac Cath :  Activity level: can do a flgiht of stairs without difficulty  Sleep Study/ CPAP : none  Fasting Blood Sugar :      / Checks Blood Sugar -- times a day:   Blood Thinner/ Instructions /Last Dose: ASA / Instructions/ Last Dose :  CBC done 03/09/22 routed to Dr Karie Soda.  HGb-9.0 most recent blood transfusion on 02/26/22.   Leticia Clas made aware on 03/09/22.  PT very HOH does not wear hearing aids.  Has some memory issues.  Daughter present at preop.  Daughter to be present day of surgery.  Hgba1c-03/09/22- 5.7

## 2022-03-09 ENCOUNTER — Encounter (HOSPITAL_COMMUNITY)
Admission: RE | Admit: 2022-03-09 | Discharge: 2022-03-09 | Disposition: A | Discharge status: Home / Self Care | Payer: Medicare Other | Source: Ambulatory Visit | Attending: Surgery | Admitting: Surgery

## 2022-03-09 ENCOUNTER — Encounter (HOSPITAL_COMMUNITY): Payer: Self-pay | Admitting: Physician Assistant

## 2022-03-09 ENCOUNTER — Other Ambulatory Visit: Payer: Self-pay

## 2022-03-09 ENCOUNTER — Encounter (HOSPITAL_COMMUNITY): Payer: Self-pay

## 2022-03-09 VITALS — BP 122/73 | HR 70 | Temp 98.4°F | Resp 16 | Ht 69.0 in | Wt 173.0 lb

## 2022-03-09 DIAGNOSIS — C182 Malignant neoplasm of ascending colon: Secondary | ICD-10-CM | POA: Diagnosis not present

## 2022-03-09 DIAGNOSIS — Z01812 Encounter for preprocedural laboratory examination: Secondary | ICD-10-CM | POA: Insufficient documentation

## 2022-03-09 DIAGNOSIS — R739 Hyperglycemia, unspecified: Secondary | ICD-10-CM | POA: Insufficient documentation

## 2022-03-09 DIAGNOSIS — Z01818 Encounter for other preprocedural examination: Secondary | ICD-10-CM

## 2022-03-09 HISTORY — DX: Anemia, unspecified: D64.9

## 2022-03-09 LAB — BASIC METABOLIC PANEL
Anion gap: 5 (ref 5–15)
BUN: 13 mg/dL (ref 8–23)
CO2: 24 mmol/L (ref 22–32)
Calcium: 8.8 mg/dL — ABNORMAL LOW (ref 8.9–10.3)
Chloride: 112 mmol/L — ABNORMAL HIGH (ref 98–111)
Creatinine, Ser: 1.15 mg/dL (ref 0.61–1.24)
GFR, Estimated: >60 mL/min (ref >60)
Glucose, Bld: 120 mg/dL — ABNORMAL HIGH (ref 70–99)
Potassium: 4 mmol/L (ref 3.5–5.1)
Sodium: 141 mmol/L (ref 135–145)

## 2022-03-09 LAB — CBC
HCT: 31.2 % — ABNORMAL LOW (ref 39.0–52.0)
Hemoglobin: 9 g/dL — ABNORMAL LOW (ref 13.0–17.0)
MCH: 22.3 pg — ABNORMAL LOW (ref 26.0–34.0)
MCHC: 28.8 g/dL — ABNORMAL LOW (ref 30.0–36.0)
MCV: 77.2 fL — ABNORMAL LOW (ref 80.0–100.0)
Platelets: 391 10*3/uL (ref 150–400)
RBC: 4.04 MIL/uL — ABNORMAL LOW (ref 4.22–5.81)
RDW: 22.9 % — ABNORMAL HIGH (ref 11.5–15.5)
WBC: 6 10*3/uL (ref 4.0–10.5)
nRBC: 0 % (ref 0.0–0.2)

## 2022-03-09 LAB — HEMOGLOBIN A1C
Hgb A1c MFr Bld: 5.7 % — ABNORMAL HIGH (ref 4.8–5.6)
Mean Plasma Glucose: 116.89 mg/dL

## 2022-03-15 ENCOUNTER — Inpatient Hospital Stay (HOSPITAL_COMMUNITY): Admission: RE | Admit: 2022-03-15 | Payer: Medicare Other | Source: Ambulatory Visit | Admitting: Surgery

## 2022-03-15 DIAGNOSIS — Z1331 Encounter for screening for depression: Secondary | ICD-10-CM | POA: Diagnosis not present

## 2022-03-15 DIAGNOSIS — R739 Hyperglycemia, unspecified: Secondary | ICD-10-CM

## 2022-03-15 DIAGNOSIS — E785 Hyperlipidemia, unspecified: Secondary | ICD-10-CM | POA: Diagnosis not present

## 2022-03-15 DIAGNOSIS — N401 Enlarged prostate with lower urinary tract symptoms: Secondary | ICD-10-CM | POA: Diagnosis not present

## 2022-03-15 DIAGNOSIS — D509 Iron deficiency anemia, unspecified: Secondary | ICD-10-CM | POA: Diagnosis not present

## 2022-03-15 DIAGNOSIS — Z01818 Encounter for other preprocedural examination: Secondary | ICD-10-CM

## 2022-03-15 DIAGNOSIS — Z1339 Encounter for screening examination for other mental health and behavioral disorders: Secondary | ICD-10-CM | POA: Diagnosis not present

## 2022-03-15 DIAGNOSIS — C186 Malignant neoplasm of descending colon: Secondary | ICD-10-CM | POA: Diagnosis not present

## 2022-03-15 DIAGNOSIS — G3184 Mild cognitive impairment, so stated: Secondary | ICD-10-CM | POA: Diagnosis not present

## 2022-03-15 DIAGNOSIS — H9190 Unspecified hearing loss, unspecified ear: Secondary | ICD-10-CM | POA: Diagnosis not present

## 2022-03-15 SURGERY — COLECTOMY, RIGHT, ROBOT-ASSISTED
Anesthesia: General

## 2022-03-21 DIAGNOSIS — Z79899 Other long term (current) drug therapy: Secondary | ICD-10-CM | POA: Diagnosis not present

## 2022-03-21 DIAGNOSIS — K922 Gastrointestinal hemorrhage, unspecified: Secondary | ICD-10-CM | POA: Diagnosis not present

## 2022-03-21 DIAGNOSIS — Z7189 Other specified counseling: Secondary | ICD-10-CM | POA: Diagnosis not present

## 2022-03-21 DIAGNOSIS — Z01818 Encounter for other preprocedural examination: Secondary | ICD-10-CM | POA: Diagnosis not present

## 2022-03-21 DIAGNOSIS — D500 Iron deficiency anemia secondary to blood loss (chronic): Secondary | ICD-10-CM | POA: Diagnosis not present

## 2022-03-21 DIAGNOSIS — Z1331 Encounter for screening for depression: Secondary | ICD-10-CM | POA: Diagnosis not present

## 2022-03-21 DIAGNOSIS — C182 Malignant neoplasm of ascending colon: Secondary | ICD-10-CM | POA: Diagnosis not present

## 2022-03-21 DIAGNOSIS — N400 Enlarged prostate without lower urinary tract symptoms: Secondary | ICD-10-CM | POA: Diagnosis not present

## 2022-03-21 DIAGNOSIS — Z9189 Other specified personal risk factors, not elsewhere classified: Secondary | ICD-10-CM | POA: Diagnosis not present

## 2022-03-21 DIAGNOSIS — H9190 Unspecified hearing loss, unspecified ear: Secondary | ICD-10-CM | POA: Diagnosis not present

## 2022-03-21 DIAGNOSIS — K921 Melena: Secondary | ICD-10-CM | POA: Diagnosis not present

## 2022-03-21 DIAGNOSIS — I2510 Atherosclerotic heart disease of native coronary artery without angina pectoris: Secondary | ICD-10-CM | POA: Diagnosis not present

## 2022-03-21 DIAGNOSIS — G3184 Mild cognitive impairment, so stated: Secondary | ICD-10-CM | POA: Diagnosis not present

## 2022-03-21 DIAGNOSIS — Z7982 Long term (current) use of aspirin: Secondary | ICD-10-CM | POA: Diagnosis not present

## 2022-03-21 DIAGNOSIS — H903 Sensorineural hearing loss, bilateral: Secondary | ICD-10-CM | POA: Diagnosis not present

## 2022-03-21 DIAGNOSIS — D509 Iron deficiency anemia, unspecified: Secondary | ICD-10-CM | POA: Diagnosis not present

## 2022-03-21 DIAGNOSIS — Z713 Dietary counseling and surveillance: Secondary | ICD-10-CM | POA: Diagnosis not present

## 2022-03-27 DIAGNOSIS — Z7982 Long term (current) use of aspirin: Secondary | ICD-10-CM | POA: Diagnosis not present

## 2022-03-27 DIAGNOSIS — G8918 Other acute postprocedural pain: Secondary | ICD-10-CM | POA: Diagnosis not present

## 2022-03-27 DIAGNOSIS — G3184 Mild cognitive impairment, so stated: Secondary | ICD-10-CM | POA: Diagnosis present

## 2022-03-27 DIAGNOSIS — C182 Malignant neoplasm of ascending colon: Secondary | ICD-10-CM | POA: Diagnosis present

## 2022-03-27 DIAGNOSIS — I2510 Atherosclerotic heart disease of native coronary artery without angina pectoris: Secondary | ICD-10-CM | POA: Diagnosis present

## 2022-03-27 DIAGNOSIS — C188 Malignant neoplasm of overlapping sites of colon: Secondary | ICD-10-CM | POA: Diagnosis not present

## 2022-03-27 DIAGNOSIS — Z9189 Other specified personal risk factors, not elsewhere classified: Secondary | ICD-10-CM | POA: Diagnosis not present

## 2022-03-27 DIAGNOSIS — Z8719 Personal history of other diseases of the digestive system: Secondary | ICD-10-CM | POA: Diagnosis not present

## 2022-03-27 DIAGNOSIS — C189 Malignant neoplasm of colon, unspecified: Secondary | ICD-10-CM | POA: Diagnosis not present

## 2022-03-27 DIAGNOSIS — Z87828 Personal history of other (healed) physical injury and trauma: Secondary | ICD-10-CM | POA: Diagnosis not present

## 2022-03-27 DIAGNOSIS — G4700 Insomnia, unspecified: Secondary | ICD-10-CM | POA: Diagnosis present

## 2022-03-27 DIAGNOSIS — K117 Disturbances of salivary secretion: Secondary | ICD-10-CM | POA: Diagnosis present

## 2022-03-27 DIAGNOSIS — R338 Other retention of urine: Secondary | ICD-10-CM | POA: Diagnosis present

## 2022-03-27 DIAGNOSIS — N401 Enlarged prostate with lower urinary tract symptoms: Secondary | ICD-10-CM | POA: Diagnosis present

## 2022-03-27 DIAGNOSIS — E7800 Pure hypercholesterolemia, unspecified: Secondary | ICD-10-CM | POA: Diagnosis present

## 2022-03-28 ENCOUNTER — Encounter: Payer: Self-pay | Admitting: *Deleted

## 2022-03-28 NOTE — Progress Notes (Signed)
PATIENT NAVIGATOR PROGRESS NOTE  Name: David Mahoney Date: 03/28/2022 MRN: [id number]  DOB: 1945-12-31   Reason for visit:  F/U after surgery  Comments:  Mr David Mahoney had surgery on 6/20 at [facility]. Called and spoke with daughter [name] and she stated he is doing well after surgery and was actually D/C today and tolerating diet. F/U appt is set up for 7/11 however encouraged her to call with any issues or questions    Time spent counseling/coordinating care: 30-45 minutes

## 2022-04-02 ENCOUNTER — Inpatient Hospital Stay: Payer: Medicare Other | Admitting: Oncology

## 2022-04-04 DIAGNOSIS — C182 Malignant neoplasm of ascending colon: Secondary | ICD-10-CM | POA: Diagnosis not present

## 2022-04-04 DIAGNOSIS — D120 Benign neoplasm of cecum: Secondary | ICD-10-CM | POA: Diagnosis not present

## 2022-04-04 DIAGNOSIS — K2950 Unspecified chronic gastritis without bleeding: Secondary | ICD-10-CM | POA: Diagnosis not present

## 2022-04-04 DIAGNOSIS — C189 Malignant neoplasm of colon, unspecified: Secondary | ICD-10-CM | POA: Diagnosis not present

## 2022-04-04 DIAGNOSIS — C180 Malignant neoplasm of cecum: Secondary | ICD-10-CM | POA: Diagnosis not present

## 2022-04-17 ENCOUNTER — Inpatient Hospital Stay: Payer: Medicare Other | Attending: Oncology | Admitting: Oncology

## 2022-04-17 ENCOUNTER — Encounter: Payer: Self-pay | Admitting: Oncology

## 2022-04-17 VITALS — BP 123/72 | HR 90 | Temp 98.1°F | Resp 18 | Ht 69.0 in | Wt 172.0 lb

## 2022-04-17 DIAGNOSIS — D630 Anemia in neoplastic disease: Secondary | ICD-10-CM | POA: Insufficient documentation

## 2022-04-17 DIAGNOSIS — C180 Malignant neoplasm of cecum: Secondary | ICD-10-CM | POA: Insufficient documentation

## 2022-04-17 DIAGNOSIS — E785 Hyperlipidemia, unspecified: Secondary | ICD-10-CM | POA: Insufficient documentation

## 2022-04-17 DIAGNOSIS — C182 Malignant neoplasm of ascending colon: Secondary | ICD-10-CM | POA: Diagnosis not present

## 2022-04-17 NOTE — Progress Notes (Signed)
[place] OFFICE PROGRESS NOTE   Diagnosis: Colon cancer  INTERVAL HISTORY:   David Mahoney underwent a robotic right colectomy at [facility] on 03/27/2022.  A tumor was noted at the mid right colon.  There was no evidence of liver or peritoneal disease.  The pathology revealed a moderately differentiated adenocarcinoma invading the submucosa.  No lymphovascular or perineural invasion.  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  Resection margins were negative.  22 lymph nodes were negative for metastatic disease.  The tumor returned microsatellite stable with retained mismatch repair protein expression.  He was discharged 03/28/2022.  He continues to have "tightness "at the abdomen.  He is eating.  His bowels are functioning.  [name] is here with his daughter.  Objective:  Vital signs in last 24 hours:  Blood pressure 123/72, pulse 90, temperature 98.1 F (36.7 C), temperature source Oral, resp. rate 18, height 5' 9"  (1.753 m), weight 172 lb (78 kg), SpO2 100 %.     Lymphatics: No cervical, supraclavicular, axillary, or inguinal nodes Resp: Lungs clear bilaterally Cardio: Regular rate and rhythm GI: No hepatosplenomegaly, nontender, mild induration surrounding trocar sites, the low midline incision is superficially open with a serous discharge and mild surrounding erythema Vascular: No leg edema   Lab Results:  Lab Results  Component Value Date   WBC 6.0 03/09/2022   HGB 9.0 (L) 03/09/2022   HCT 31.2 (L) 03/09/2022   MCV 77.2 (L) 03/09/2022   PLT 391 03/09/2022   NEUTROABS 5.7 02/23/2022    CMP  Lab Results  Component Value Date   NA 141 03/09/2022   K 4.0 03/09/2022   CL 112 (H) 03/09/2022   CO2 24 03/09/2022   GLUCOSE 120 (H) 03/09/2022   BUN 13 03/09/2022   CREATININE 1.15 03/09/2022   CALCIUM 8.8 (L) 03/09/2022   PROT 6.5 02/23/2022   ALBUMIN 4.1 02/23/2022   AST 17 02/23/2022   ALT 19 02/23/2022   ALKPHOS 41 02/23/2022   BILITOT 1.1  02/23/2022   GFRNONAA >60 03/09/2022   GFRAA  09/14/2008    >60        The eGFR has been calculated using the MDRD equation. This calculation has not been validated in all clinical    Lab Results  Component Value Date   CEA 2.4 03/01/2022     Medications: I have reviewed the patient's current medications.   Assessment/Plan: Colon cancer, stage I-colonoscopy 02/25/2022-mass at the ascending colon/cecum-biopsy invasive moderately differentiated adenocarcinoma CTs 02/28/2022-cecal/ascending colon mass, 0.5 cm right ileocolic mesenteric node, no evidence of distant metastatic disease, 0.5 cm hypodense inferior right liver lesion-0.3 cm on a CT in 2009 Robotic right colectomy 03/27/2022, moderately differentiated adenocarcinoma of the cecum invading submucosa, no lymphovascular perineural invasion, 22 negative lymph nodes,pT1pN0, MSS, mismatch repair protein expression intact  Iron deficiency anemia secondary to #1 Cognitive impairment BPH Hearing loss Hyperlipidemia Tubular adenoma on the colonoscopy 02/25/2022    Disposition: David Mahoney underwent a robotic right colectomy on 03/27/2022.  He is recovering from surgery.  He is scheduled to see Dr. Sheryn Bison within the next few days.  The midline wound remains slightly open, but does not appear grossly infected.  He will discuss the wound and exercise recommendations with Dr. Sheryn Bison.  David Mahoney has been diagnosed with stage I colon cancer.  He has an excellent prognosis for a long-term disease-free survival.  There is no indication for adjuvant systemic therapy.  He will follow-up with Dr. Lorenso Courier to discuss the  indication for colonoscopy surveillance.  [name] will return for an office visit and CEA in 5 months.  He will continue iron for the next few months.  We will check a CBC when he returns in 5 months.  I do not recommend surveillance imaging due to his age and comorbid conditions, including cognitive  impairment.  He does not appear to have hereditary nonpolyposis colon cancer syndrome, but his family members are at increased risk of developing colorectal cancer and should receive appropriate screening.  Betsy Coder, MD  04/17/2022  9:13 AM

## 2022-04-19 DIAGNOSIS — C182 Malignant neoplasm of ascending colon: Secondary | ICD-10-CM | POA: Diagnosis not present

## 2022-06-18 DIAGNOSIS — E785 Hyperlipidemia, unspecified: Secondary | ICD-10-CM | POA: Diagnosis not present

## 2022-06-18 DIAGNOSIS — D509 Iron deficiency anemia, unspecified: Secondary | ICD-10-CM | POA: Diagnosis not present

## 2022-06-18 DIAGNOSIS — G3184 Mild cognitive impairment, so stated: Secondary | ICD-10-CM | POA: Diagnosis not present

## 2022-06-18 DIAGNOSIS — C186 Malignant neoplasm of descending colon: Secondary | ICD-10-CM | POA: Diagnosis not present

## 2022-06-18 DIAGNOSIS — H9190 Unspecified hearing loss, unspecified ear: Secondary | ICD-10-CM | POA: Diagnosis not present

## 2022-06-18 DIAGNOSIS — N401 Enlarged prostate with lower urinary tract symptoms: Secondary | ICD-10-CM | POA: Diagnosis not present

## 2022-06-25 DIAGNOSIS — E785 Hyperlipidemia, unspecified: Secondary | ICD-10-CM | POA: Diagnosis not present

## 2022-06-25 DIAGNOSIS — R7989 Other specified abnormal findings of blood chemistry: Secondary | ICD-10-CM | POA: Diagnosis not present

## 2022-06-25 DIAGNOSIS — D509 Iron deficiency anemia, unspecified: Secondary | ICD-10-CM | POA: Diagnosis not present

## 2022-09-25 ENCOUNTER — Other Ambulatory Visit: Payer: Self-pay

## 2022-09-25 ENCOUNTER — Telehealth: Payer: Self-pay

## 2022-09-25 ENCOUNTER — Inpatient Hospital Stay: Payer: Medicare Other | Attending: Oncology

## 2022-09-25 ENCOUNTER — Inpatient Hospital Stay (HOSPITAL_BASED_OUTPATIENT_CLINIC_OR_DEPARTMENT_OTHER): Payer: Medicare Other | Admitting: Oncology

## 2022-09-25 VITALS — BP 132/82 | HR 72 | Temp 98.2°F | Resp 20 | Ht 69.0 in | Wt 174.2 lb

## 2022-09-25 DIAGNOSIS — C182 Malignant neoplasm of ascending colon: Secondary | ICD-10-CM

## 2022-09-25 DIAGNOSIS — Z85038 Personal history of other malignant neoplasm of large intestine: Secondary | ICD-10-CM | POA: Insufficient documentation

## 2022-09-25 DIAGNOSIS — Z08 Encounter for follow-up examination after completed treatment for malignant neoplasm: Secondary | ICD-10-CM | POA: Insufficient documentation

## 2022-09-25 DIAGNOSIS — R4189 Other symptoms and signs involving cognitive functions and awareness: Secondary | ICD-10-CM | POA: Diagnosis not present

## 2022-09-25 DIAGNOSIS — D509 Iron deficiency anemia, unspecified: Secondary | ICD-10-CM | POA: Diagnosis not present

## 2022-09-25 LAB — CBC WITH DIFFERENTIAL (CANCER CENTER ONLY)
Abs Immature Granulocytes: 0.01 10*3/uL (ref 0.00–0.07)
Basophils Absolute: 0.1 10*3/uL (ref 0.0–0.1)
Basophils Relative: 1 %
Eosinophils Absolute: 0.1 10*3/uL (ref 0.0–0.5)
Eosinophils Relative: 2 %
HCT: 47 % (ref 39.0–52.0)
Hemoglobin: 15.1 g/dL (ref 13.0–17.0)
Immature Granulocytes: 0 %
Lymphocytes Relative: 29 %
Lymphs Abs: 1.5 10*3/uL (ref 0.7–4.0)
MCH: 28.7 pg (ref 26.0–34.0)
MCHC: 32.1 g/dL (ref 30.0–36.0)
MCV: 89.4 fL (ref 80.0–100.0)
Monocytes Absolute: 0.4 10*3/uL (ref 0.1–1.0)
Monocytes Relative: 8 %
Neutro Abs: 3.2 10*3/uL (ref 1.7–7.7)
Neutrophils Relative %: 60 %
Platelet Count: 215 10*3/uL (ref 150–400)
RBC: 5.26 MIL/uL (ref 4.22–5.81)
RDW: 18.1 % — ABNORMAL HIGH (ref 11.5–15.5)
WBC Count: 5.2 10*3/uL (ref 4.0–10.5)
nRBC: 0 % (ref 0.0–0.2)

## 2022-09-25 LAB — CEA (ACCESS): CEA (CHCC): 4.06 ng/mL (ref 0.00–5.00)

## 2022-09-25 NOTE — Progress Notes (Signed)
  [place] OFFICE PROGRESS NOTE   Diagnosis: Colon cancer  INTERVAL HISTORY:   David. Mahoney returns as scheduled.  Good appetite and energy level.  No difficulty with bowel or bladder function.  No bleeding.  He is taking iron.  He reports mild intermittent discomfort at the right lower back.  Objective:  Vital signs in last 24 hours:  Blood pressure 132/82, pulse 72, temperature 98.2 F (36.8 C), temperature source Oral, resp. rate 20, height _0  (1.753 m), weight 174 lb 3.2 oz (79 kg), SpO2 99 %.    Lymphatics: No cervical, supraclavicular, axillary, or inguinal nodes Resp: Lungs clear bilaterally Cardio: Regular rate and rhythm GI: No hepatosplenomegaly, no mass, nontender Vascular: No leg edema  Skin: Multiple benign-appearing moles over the trunk, several superficial ulcers-she reports scratching these areas   Lab Results:  Lab Results  Component Value Date   WBC 5.2 09/25/2022   HGB 15.1 09/25/2022   HCT 47.0 09/25/2022   MCV 89.4 09/25/2022   PLT 215 09/25/2022   NEUTROABS 3.2 09/25/2022    CMP  Lab Results  Component Value Date   NA 141 03/09/2022   K 4.0 03/09/2022   CL 112 (H) 03/09/2022   CO2 24 03/09/2022   GLUCOSE 120 (H) 03/09/2022   BUN 13 03/09/2022   CREATININE 1.15 03/09/2022   CALCIUM 8.8 (L) 03/09/2022   PROT 6.5 02/23/2022   ALBUMIN 4.1 02/23/2022   AST 17 02/23/2022   ALT 19 02/23/2022   ALKPHOS 41 02/23/2022   BILITOT 1.1 02/23/2022   GFRNONAA >60 03/09/2022   GFRAA  09/14/2008    >60        The eGFR has been calculated using the MDRD equation. This calculation has not been validated in all clinical    Lab Results  Component Value Date   CEA 2.4 03/01/2022      Medications: I have reviewed the patient's current medications.   Assessment/Plan: Colon cancer, stage I-colonoscopy 02/25/2022-mass at the ascending colon/cecum-biopsy invasive moderately differentiated adenocarcinoma CTs  02/28/2022-cecal/ascending colon mass, 0.5 cm right ileocolic mesenteric node, no evidence of distant metastatic disease, 0.5 cm hypodense inferior right liver lesion-0.3 cm on a CT in 2009 Robotic right colectomy 03/27/2022, moderately differentiated adenocarcinoma of the cecum invading submucosa, no lymphovascular perineural invasion, 22 negative lymph nodes,pT1pN0, MSS, mismatch repair protein expression intact  Iron deficiency anemia secondary to #1-normal Cognitive impairment BPH Hearing loss Hyperlipidemia Tubular adenoma on the colonoscopy 02/25/2022     Disposition: David Jenny in clinical remission from colon cancer.  Iron deficiency anemia has corrected.  Will follow-up on the CEA from today.  He will return for an office visit and CEA in 6 months.  He appears to have significant dementia.  I do not recommend surveillance imaging.  Dr. Lorenso Courier can decide on the indication for a surveillance colonoscopy.  Betsy Coder, MD  09/25/2022  8:33 AM

## 2022-09-25 NOTE — Telephone Encounter (Signed)
-----   Message from Ladell Pier, MD sent at 09/25/2022  1:46 PM EST ----- Please call patient-daughter, the CEA in upper end of normal range, likely benign finding, repeat in 3 months, follow-up as scheduled

## 2022-09-25 NOTE — Telephone Encounter (Signed)
Patient's daughter gave verbal understanding and had no further questions or concerns

## 2022-12-25 ENCOUNTER — Inpatient Hospital Stay: Payer: Medicare Other | Attending: Oncology

## 2023-01-30 ENCOUNTER — Encounter: Payer: Self-pay | Admitting: Diagnostic Neuroimaging

## 2023-01-30 ENCOUNTER — Ambulatory Visit (INDEPENDENT_AMBULATORY_CARE_PROVIDER_SITE_OTHER): Payer: Medicare Other | Admitting: Diagnostic Neuroimaging

## 2023-01-30 VITALS — BP 116/76 | HR 78 | Ht 68.0 in | Wt 171.6 lb

## 2023-01-30 DIAGNOSIS — F03A0 Unspecified dementia, mild, without behavioral disturbance, psychotic disturbance, mood disturbance, and anxiety: Secondary | ICD-10-CM | POA: Diagnosis not present

## 2023-01-30 MED ORDER — MEMANTINE HCL 10 MG PO TABS: 10.0000 mg | ORAL_TABLET | Freq: Two times a day (BID) | ORAL | 12 refills | Status: AC

## 2023-01-30 NOTE — Patient Instructions (Signed)
MEMORY LOSS (mild dementia; onset ~2017; MMSE 22/30; mild change in ADLs) - check labs - refer to audiology - start memantine  at bedtime; increase to twice a day after 1-2 weeks - safety / supervision issues reviewed - daily physical activity / exercise (at least 15-30 minutes) - eat more plants / vegetables - increase social activities, brain stimulation, games, puzzles, hobbies, crafts, arts, music - aim for at least 7-8 hours sleep per night (or more) - avoid smoking and alcohol - caregiver resources provided - caution with medications, finances, driving

## 2023-01-30 NOTE — Progress Notes (Signed)
[facility]  PATIENT: David Mahoney DOB: Oct 04, 1946  REFERRING CLINICIAN: Creola Corn, MD HISTORY FROM: patient and daughter REASON FOR VISIT: new consult   HISTORICAL  CHIEF COMPLAINT:  Chief Complaint  Patient presents with   New Patient (Initial Visit)    Patient in room #7 with daughter. Patient states he here to discuss memory loss and his hearing.    HISTORY OF PRESENT ILLNESS:   77 year old male here for evaluation of dementia.  Patient had onset of mild short-term memory loss problems around 2017 or 2018.  At that time patient's wife was also dealing with esophageal cancer until her death in 2018/06/06.  He had evaluation at [facility] neurology with neuropsychology testing.  At that time he was diagnosed with mild cognitive impairment.  He was started on donepezil.  Since that time symptoms have continued to progress.  Having issues with short-term memory, recall, repeating himself, losing train of thought, having more difficulty with finances, medications, transportation.   REVIEW OF SYSTEMS: Full 14 system review of systems performed and negative with exception of: as per HPI.  ALLERGIES: No Known Allergies  HOME MEDICATIONS: Outpatient Medications Prior to Visit  Medication Sig Dispense Refill   Ascorbic Acid (VITAMIN C) 1000 MG tablet Take 1,000 mg by mouth daily.     CALCIUM PO Take 1 tablet by mouth daily. (Patient not taking: Reported on 04/17/2022)     Cyanocobalamin (VITAMIN B-12 PO) Take 3,000 mcg by mouth daily. (Patient not taking: Reported on 04/17/2022)     ferrous sulfate 325 (65 FE) MG tablet Take 325 mg by mouth 2 (two) times daily with a meal. (Patient not taking: Reported on 01/30/2023)     OVER THE COUNTER MEDICATION Take 1 capsule by mouth daily. Super beta prostate (Patient not taking: Reported on 04/17/2022)     Saw Palmetto 450 MG CAPS Take 450 mg by mouth daily. (Patient not taking: Reported on 04/17/2022)     zinc sulfate 220  (50 Zn) MG capsule Take 220 mg by mouth daily. (Patient not taking: Reported on 04/17/2022)     No facility-administered medications prior to visit.    PAST MEDICAL HISTORY: Past Medical History:  Diagnosis Date   Anemia    BPH (benign prostatic hyperplasia)    Per daughter   Hyperlipidemia     PAST SURGICAL HISTORY: Past Surgical History:  Procedure Laterality Date   BIOPSY  02/25/2022   Procedure: BIOPSY;  Surgeon: Imogene Burn, MD;  Location: [facility] ENDOSCOPY;  Service: Gastroenterology;;   COLONOSCOPY WITH PROPOFOL N/A 02/25/2022   Procedure: COLONOSCOPY WITH PROPOFOL;  Surgeon: Imogene Burn, MD;  Location[facility] ENDOSCOPY;  Service: Gastroenterology;  Laterality: N/A;   ESOPHAGOGASTRODUODENOSCOPY (EGD) WITH PROPOFOL N/A 02/25/2022   Procedure: ESOPHAGOGASTRODUODENOSCOPY (EGD) WITH PROPOFOL;  Surgeon: Imogene Burn, MD;  Location: [facility] ENDOSCOPY;  Service: Gastroenterology;  Laterality: N/A;   HEMOSTASIS CLIP PLACEMENT  02/25/2022   Procedure: HEMOSTASIS CLIP PLACEMENT;  Surgeon: Imogene Burn, MD;  Location: [facility] ENDOSCOPY;  Service: Gastroenterology;;   POLYPECTOMY  02/25/2022   Procedure: POLYPECTOMY;  Surgeon: Imogene Burn, MD;  Location: [facility] ENDOSCOPY;  Service: Gastroenterology;;   Susa Day  02/25/2022   Procedure: Susa Day;  Surgeon: Imogene Burn, MD;  Location: [facility] ENDOSCOPY;  Service: Gastroenterology;;   SUBMUCOSAL TATTOO INJECTION  02/25/2022   Procedure: SUBMUCOSAL TATTOO INJECTION;  Surgeon: Imogene Burn, MD;  Location: [facility] ENDOSCOPY;  Service: Gastroenterology;;   VASECTOMY      FAMILY HISTORY:  Family History  Problem Relation Age of Onset   Heart disease Mother    Diabetes Father    Diabetes Brother     SOCIAL HISTORY: Social History   Socioeconomic History   Marital status: Widowed    Spouse name: Not on file   Number of children: Not on file   Years of education: Not on file   Highest education level: Not on file  Occupational History   Not on  file  Tobacco Use   Smoking status: Never   Smokeless tobacco: Never  Vaping Use   Vaping Use: Never used  Substance and Sexual Activity   Alcohol use: Yes    Comment: winbe- 2 glasses dialy   Drug use: Never   Sexual activity: Not on file  Other Topics Concern   Not on file  Social History Narrative   Not on file   Social Determinants of Health   Financial Resource Strain: Not on file  Food Insecurity: Not on file  Transportation Needs: Not on file  Physical Activity: Not on file  Stress: Not on file  Social Connections: Not on file  Intimate Partner Violence: Not on file     PHYSICAL EXAM  GENERAL EXAM/CONSTITUTIONAL: Vitals:  Vitals:   01/30/23 1126  BP: 116/76  Pulse: 78  Weight: 171 lb 9.6 oz (77.8 kg)  Height:  (1.727 m)   Body mass index is 26.09 kg/m. Wt Readings from Last 3 Encounters:  01/30/23 171 lb 9.6 oz (77.8 kg)  09/25/22 174 lb 3.2 oz (79 kg)  04/17/22 172 lb (78 kg)   Patient is in no distress; well developed, nourished and groomed; neck is supple  CARDIOVASCULAR: Examination of carotid arteries is normal; no carotid bruits Regular rate and rhythm, no murmurs Examination of peripheral vascular system by observation and palpation is normal  EYES: Ophthalmoscopic exam of optic discs and posterior segments is normal; no papilledema or hemorrhages No results found.  MUSCULOSKELETAL: Gait, strength, tone, movements noted in Neurologic exam below  NEUROLOGIC: MENTAL STATUS:     01/30/2023   11:29 AM  MMSE - Mini Mental State Exam  Orientation to time 2  Orientation to Place 5  Registration 3  Attention/ Calculation 3  Recall 0  Language- name 2 objects 2  Language- repeat 1  Language- follow 3 step command 3  Language- read & follow direction 1  Write a sentence 1  Copy design 1  Total score 22   awake, alert, oriented to person, place and time recent and remote memory intact; SLIGHTLY REPETATIVE normal attention and  concentration language fluent, comprehension intact, naming intact fund of knowledge appropriate  CRANIAL NERVE:  2nd - no papilledema on fundoscopic exam 2nd, 3rd, 4th, 6th - pupils equal and reactive to light, visual fields full to confrontation, extraocular muscles intact, no nystagmus 5th - facial sensation symmetric 7th - facial strength symmetric 8th - hearing intact 9th - palate elevates symmetrically, uvula midline 11th - shoulder shrug symmetric 12th - tongue protrusion midline  MOTOR:  normal bulk and tone, full strength in the BUE, BLE  SENSORY:  normal and symmetric to light touch, temperature, vibration  COORDINATION:  finger-nose-finger, fine finger movements normal  REFLEXES:  deep tendon reflexes TRACE and symmetric  GAIT/STATION:  narrow based gait; romberg is negative     DIAGNOSTIC DATA (LABS, IMAGING, TESTING) - I reviewed patient records, labs, notes, testing and imaging myself where available.  Lab Results  Component Value Date   WBC  5.2 09/25/2022   HGB 15.1 09/25/2022   HCT 47.0 09/25/2022   MCV 89.4 09/25/2022   PLT 215 09/25/2022      Component Value Date/Time   NA 141 03/09/2022 0933   K 4.0 03/09/2022 0933   CL 112 (H) 03/09/2022 0933   CO2 24 03/09/2022 0933   GLUCOSE 120 (H) 03/09/2022 0933   BUN 13 03/09/2022 0933   CREATININE 1.15 03/09/2022 0933   CALCIUM 8.8 (L) 03/09/2022 0933   PROT 6.5 02/23/2022 1528   ALBUMIN 4.1 02/23/2022 1528   AST 17 02/23/2022 1528   ALT 19 02/23/2022 1528   ALKPHOS 41 02/23/2022 1528   BILITOT 1.1 02/23/2022 1528   GFRNONAA >60 03/09/2022 0933   GFRAA  09/14/2008 1420    >60        The eGFR has been calculated using the MDRD equation. This calculation has not been validated in all clinical   No results found for: "CHOL", "HDL", "LDLCALC", "LDLDIRECT", "TRIG", "CHOLHDL" Lab Results  Component Value Date   HGBA1C 5.7 (H) 03/09/2022   No results found for: "VITAMINB12" No results found  for: "TSH"   10/30/18 MRI brain 1. No acute intracranial abnormalities.  2. 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  3. Chronic lateral left cerebellar infarct.    11/05/18 Neuropsychology testing -Lower functioning in all areas of memory than would be expected given his overall level of intellectual functioning -Moderate impairment in auditory memory and severe impairment.  Delayed memory -Consistent with presence of major neurocognitive disorder   ASSESSMENT AND PLAN  77 y.o. year old male here with:   Dx:  1. Mild dementia without behavioral disturbance, psychotic disturbance, mood disturbance, or anxiety, unspecified dementia type     PLAN:  MEMORY LOSS (mild dementia; onset ~2017; MMSE 22/30; mild change in ADLs) - check labs - refer to audiology (hearing loss) - start memantine  at bedtime; increase to twice a day after 1-2 weeks - safety / supervision issues reviewed - daily physical activity / exercise (at least 15-30 minutes) - eat more plants / vegetables - increase social activities, brain stimulation, games, puzzles, hobbies, crafts, arts, music - aim for at least 7-8 hours sleep per night (or more) - avoid smoking and alcohol - caregiver resources provided - caution with medications, finances, driving  Orders Placed This Encounter  Procedures   ATN PROFILE   Vitamin B12   TSH Rfx on Abnormal to Free T4   Ambulatory referral to Audiology   Meds ordered this encounter  Medications   memantine (NAMENDA) 10 MG tablet    Sig: Take 1 tablet (10 mg total) by mouth 2 (two) times daily.    Dispense:  60 tablet    Refill:  12   Return in about 6 months (around 08/01/2023).  I spent 65 minutes of face-to-face and non-face-to-face time with patient.  This included previsit chart review, lab review, study review, order entry, electronic health record documentation, patient education.     Suanne Marker, MD 01/30/2023, 12:26  PM Certified in Neurology, Neurophysiology and Neuroimaging  [facility] [address] [place], [place] [address] [phone]

## 2023-02-01 ENCOUNTER — Other Ambulatory Visit: Payer: Self-pay | Admitting: *Deleted

## 2023-02-01 ENCOUNTER — Other Ambulatory Visit (INDEPENDENT_AMBULATORY_CARE_PROVIDER_SITE_OTHER): Payer: Self-pay

## 2023-02-01 DIAGNOSIS — F03A0 Unspecified dementia, mild, without behavioral disturbance, psychotic disturbance, mood disturbance, and anxiety: Secondary | ICD-10-CM

## 2023-02-01 DIAGNOSIS — Z0289 Encounter for other administrative examinations: Secondary | ICD-10-CM

## 2023-02-04 LAB — ATN PROFILE

## 2023-02-05 LAB — ATN PROFILE

## 2023-02-05 LAB — VITAMIN B12: Vitamin B-12: 409 pg/mL (ref 232–1245)

## 2023-02-07 LAB — ATN PROFILE

## 2023-02-08 LAB — TSH RFX ON ABNORMAL TO FREE T4: TSH: 1.09 u[IU]/mL (ref 0.450–4.500)

## 2023-02-08 LAB — ATN PROFILE
N -- NfL, Plasma: 4.37 pg/mL (ref 0.00–7.64)
T -- p-tau181: 2.18 pg/mL — ABNORMAL HIGH (ref 0.00–0.97)

## 2023-02-23 ENCOUNTER — Other Ambulatory Visit: Payer: Self-pay | Admitting: Diagnostic Neuroimaging

## 2023-03-08 DIAGNOSIS — H524 Presbyopia: Secondary | ICD-10-CM | POA: Diagnosis not present

## 2023-03-08 DIAGNOSIS — H52223 Regular astigmatism, bilateral: Secondary | ICD-10-CM | POA: Diagnosis not present

## 2023-03-08 DIAGNOSIS — H35363 Drusen (degenerative) of macula, bilateral: Secondary | ICD-10-CM | POA: Diagnosis not present

## 2023-03-08 DIAGNOSIS — H2512 Age-related nuclear cataract, left eye: Secondary | ICD-10-CM | POA: Diagnosis not present

## 2023-03-08 DIAGNOSIS — H5212 Myopia, left eye: Secondary | ICD-10-CM | POA: Diagnosis not present

## 2023-03-08 DIAGNOSIS — H04123 Dry eye syndrome of bilateral lacrimal glands: Secondary | ICD-10-CM | POA: Diagnosis not present

## 2023-03-08 DIAGNOSIS — H5201 Hypermetropia, right eye: Secondary | ICD-10-CM | POA: Diagnosis not present

## 2023-03-28 ENCOUNTER — Inpatient Hospital Stay: Payer: Medicare Other | Admitting: Oncology

## 2023-03-28 ENCOUNTER — Telehealth: Payer: Self-pay | Admitting: Oncology

## 2023-03-28 ENCOUNTER — Inpatient Hospital Stay: Payer: Medicare Other

## 2023-04-08 DIAGNOSIS — I2510 Atherosclerotic heart disease of native coronary artery without angina pectoris: Secondary | ICD-10-CM | POA: Diagnosis not present

## 2023-04-08 DIAGNOSIS — N401 Enlarged prostate with lower urinary tract symptoms: Secondary | ICD-10-CM | POA: Diagnosis not present

## 2023-04-08 DIAGNOSIS — I700 Atherosclerosis of aorta: Secondary | ICD-10-CM | POA: Diagnosis not present

## 2023-04-08 DIAGNOSIS — Z1331 Encounter for screening for depression: Secondary | ICD-10-CM | POA: Diagnosis not present

## 2023-04-08 DIAGNOSIS — C186 Malignant neoplasm of descending colon: Secondary | ICD-10-CM | POA: Diagnosis not present

## 2023-04-08 DIAGNOSIS — Z1339 Encounter for screening examination for other mental health and behavioral disorders: Secondary | ICD-10-CM | POA: Diagnosis not present

## 2023-04-08 DIAGNOSIS — Z0000 Encounter for general adult medical examination without abnormal findings: Secondary | ICD-10-CM | POA: Diagnosis not present

## 2023-04-08 DIAGNOSIS — G3184 Mild cognitive impairment, so stated: Secondary | ICD-10-CM | POA: Diagnosis not present

## 2023-04-08 DIAGNOSIS — N400 Enlarged prostate without lower urinary tract symptoms: Secondary | ICD-10-CM | POA: Diagnosis not present

## 2023-04-08 DIAGNOSIS — Z23 Encounter for immunization: Secondary | ICD-10-CM | POA: Diagnosis not present

## 2023-04-08 DIAGNOSIS — E785 Hyperlipidemia, unspecified: Secondary | ICD-10-CM | POA: Diagnosis not present

## 2023-05-01 ENCOUNTER — Inpatient Hospital Stay: Payer: Medicare HMO | Attending: Oncology

## 2023-05-01 ENCOUNTER — Inpatient Hospital Stay (HOSPITAL_BASED_OUTPATIENT_CLINIC_OR_DEPARTMENT_OTHER): Payer: Medicare HMO | Admitting: Oncology

## 2023-05-01 VITALS — BP 125/72 | HR 71 | Temp 97.9°F | Resp 19 | Ht 68.0 in | Wt 167.0 lb

## 2023-05-01 DIAGNOSIS — E785 Hyperlipidemia, unspecified: Secondary | ICD-10-CM | POA: Diagnosis not present

## 2023-05-01 DIAGNOSIS — C182 Malignant neoplasm of ascending colon: Secondary | ICD-10-CM | POA: Insufficient documentation

## 2023-05-01 DIAGNOSIS — N400 Enlarged prostate without lower urinary tract symptoms: Secondary | ICD-10-CM | POA: Insufficient documentation

## 2023-05-01 DIAGNOSIS — D509 Iron deficiency anemia, unspecified: Secondary | ICD-10-CM | POA: Insufficient documentation

## 2023-05-01 LAB — CBC WITH DIFFERENTIAL (CANCER CENTER ONLY)
Abs Immature Granulocytes: 0.02 10*3/uL (ref 0.00–0.07)
Basophils Absolute: 0.1 10*3/uL (ref 0.0–0.1)
Basophils Relative: 1 %
Eosinophils Absolute: 0.1 10*3/uL (ref 0.0–0.5)
Eosinophils Relative: 2 %
HCT: 45.9 % (ref 39.0–52.0)
Hemoglobin: 15.8 g/dL (ref 13.0–17.0)
Immature Granulocytes: 0 %
Lymphocytes Relative: 23 %
Lymphs Abs: 1.3 10*3/uL (ref 0.7–4.0)
MCH: 32.2 pg (ref 26.0–34.0)
MCHC: 34.4 g/dL (ref 30.0–36.0)
MCV: 93.7 fL (ref 80.0–100.0)
Monocytes Absolute: 0.6 10*3/uL (ref 0.1–1.0)
Monocytes Relative: 9 %
Neutro Abs: 3.8 10*3/uL (ref 1.7–7.7)
Neutrophils Relative %: 65 %
Platelet Count: 253 10*3/uL (ref 150–400)
RBC: 4.9 MIL/uL (ref 4.22–5.81)
RDW: 13.3 % (ref 11.5–15.5)
WBC Count: 5.9 10*3/uL (ref 4.0–10.5)
nRBC: 0 % (ref 0.0–0.2)

## 2023-05-01 LAB — CEA (ACCESS): CEA (CHCC): 3.06 ng/mL (ref 0.00–5.00)

## 2023-05-01 NOTE — Progress Notes (Signed)
  [facility] OFFICE PROGRESS NOTE   Diagnosis: Colon cancer  INTERVAL HISTORY:   David Mahoney returns as scheduled.  He is here with his daughter.  No new complaint.  Good appetite.  No difficulty with bowel function.  He reports 4 term memory loss.  He recently started Namenda.  Objective:  Vital signs in last 24 hours:  Blood pressure 125/72, pulse 71, temperature 97.9 F (36.6 C), temperature source Oral, resp. rate 19, height 5\' 8"  (1.727 m), weight 167 lb (75.8 kg), SpO2 98%.    Lymphatics: No cervical, supraclavicular, axillary, or inguinal nodes Resp: Lungs clear bilaterally Cardio: Regular rate and rhythm GI: Nontender, no mass, no hepatosplenomegaly Vascular: No leg edema Neuro: Alert, not oriented to day or month  Lab Results:  Lab Results  Component Value Date   WBC 5.9 05/01/2023   HGB 15.8 05/01/2023   HCT 45.9 05/01/2023   MCV 93.7 05/01/2023   PLT 253 05/01/2023   NEUTROABS 3.8 05/01/2023    CMP  Lab Results  Component Value Date   NA 141 03/09/2022   K 4.0 03/09/2022   CL 112 (H) 03/09/2022   CO2 24 03/09/2022   GLUCOSE 120 (H) 03/09/2022   BUN 13 03/09/2022   CREATININE 1.15 03/09/2022   CALCIUM 8.8 (L) 03/09/2022   PROT 6.5 02/23/2022   ALBUMIN 4.1 02/23/2022   AST 17 02/23/2022   ALT 19 02/23/2022   ALKPHOS 41 02/23/2022   BILITOT 1.1 02/23/2022   GFRNONAA >60 03/09/2022   GFRAA  09/14/2008    >60        The eGFR has been calculated using the MDRD equation. This calculation has not been validated in all clinical    Lab Results  Component Value Date   CEA 3.06 05/01/2023      Medications: I have reviewed the patient's current medications.   Assessment/Plan: Colon cancer, stage I-colonoscopy 02/25/2022-mass at the ascending colon/cecum-biopsy invasive moderately differentiated adenocarcinoma CTs 02/28/2022-cecal/ascending colon mass, 0.5 cm right ileocolic mesenteric node, no evidence of distant metastatic  disease, 0.5 cm hypodense inferior right liver lesion-0.3 cm on a CT in 2009 Robotic right colectomy 03/27/2022, moderately differentiated adenocarcinoma of the cecum invading submucosa, no lymphovascular perineural invasion, 22 negative lymph nodes,pT1pN0, MSS, mismatch repair protein expression intact  Iron deficiency anemia secondary to #1-normal Cognitive impairment BPH Hearing loss Hyperlipidemia Tubular adenoma on the colonoscopy 02/25/2022      Disposition: David Mahoney was diagnosed with stage I colon cancer in May 2023.  He is in clinical remission.  Iron deficiency anemia present at diagnosis has resolved.  David Mahoney has significant dementia.  I discussed the indication for a surveillance colonoscopy and continued CEA surveillance with his daughter.  I recommend not proceeding with colonoscopy or laboratory surveillance.  He will continue follow-up with neurology for evaluation of dementia.  He would like to continue follow-up at the Cancer center.  He will return for an office visit in 1 year.  His daughter will contact me in the interim if he develops symptoms concerning for recurrence of colon cancer.  David Papas, MD  05/01/2023  11:08 AM

## 2023-07-23 DIAGNOSIS — R071 Chest pain on breathing: Secondary | ICD-10-CM | POA: Diagnosis not present

## 2023-07-23 DIAGNOSIS — R0781 Pleurodynia: Secondary | ICD-10-CM | POA: Diagnosis not present

## 2023-07-30 ENCOUNTER — Encounter: Payer: Self-pay | Admitting: Diagnostic Neuroimaging

## 2023-07-30 ENCOUNTER — Ambulatory Visit: Payer: Medicare Other | Admitting: Diagnostic Neuroimaging

## 2023-08-16 ENCOUNTER — Encounter: Payer: Self-pay | Admitting: Internal Medicine

## 2023-12-07 IMAGING — CT CT CHEST-ABD-PELV W/ CM
1 of 3 series · 13 of 32 positions shown, 17 images · IV contrast (APPLIED)
Comparison: 09/14/2008 CT abdomen/pelvis.

CLINICAL DATA: Rectal bleeding. Right colonic mass, new diagnosis
of invasive moderately differentiated colonic adenocarcinoma.
Staging evaluation.

* Tracking Code: BO *
EXAM:
CT CHEST, ABDOMEN, AND PELVIS WITH CONTRAST
TECHNIQUE: Multidetector CT imaging of the chest, abdomen and pelvis was
performed following the standard protocol during bolus
administration of intravenous contrast.

[Series 4: super d · axial · 0.85mm/px · z∈[-312,-64]mm · 13 of 346 slices shown, 17 images]
[im 18/346  soft-tissue]
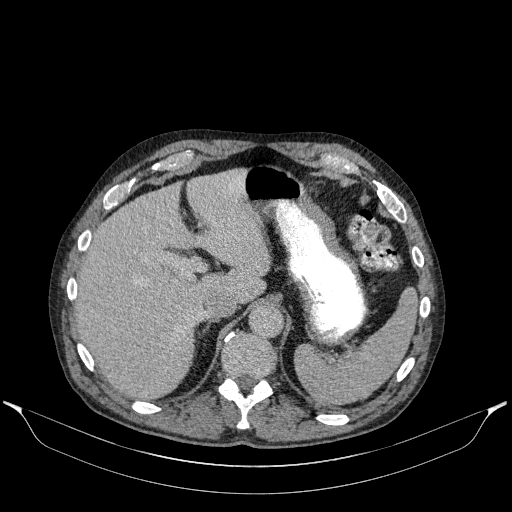
[im 18/346  bone]
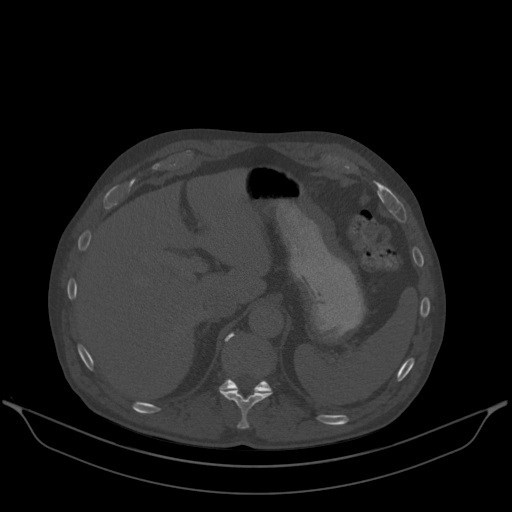
[im 52/346  soft-tissue]
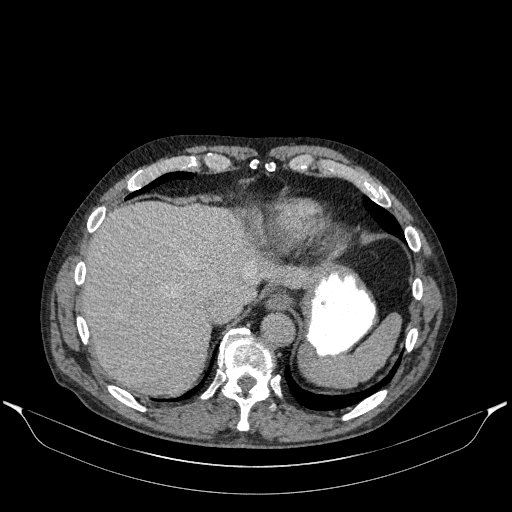
[im 87/346  soft-tissue]
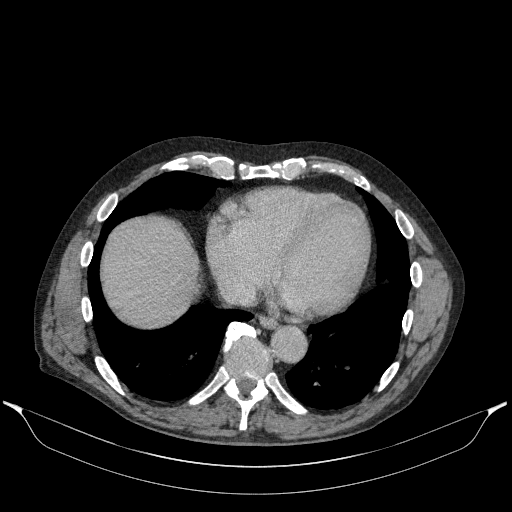
[im 121/346  soft-tissue]
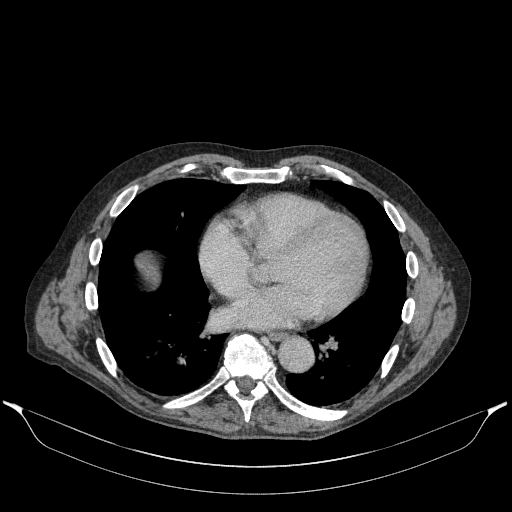
[im 139/346  soft-tissue]
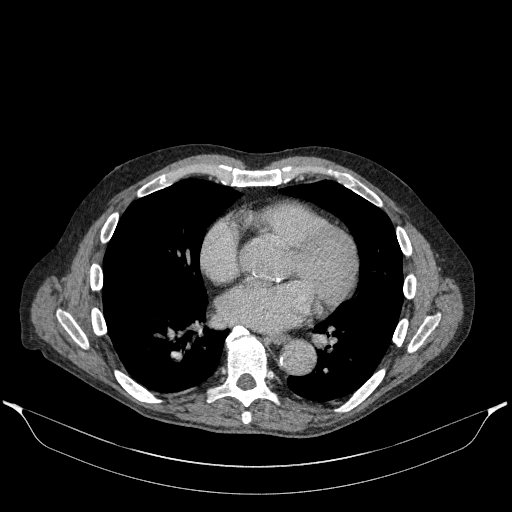
[im 173/346  soft-tissue]
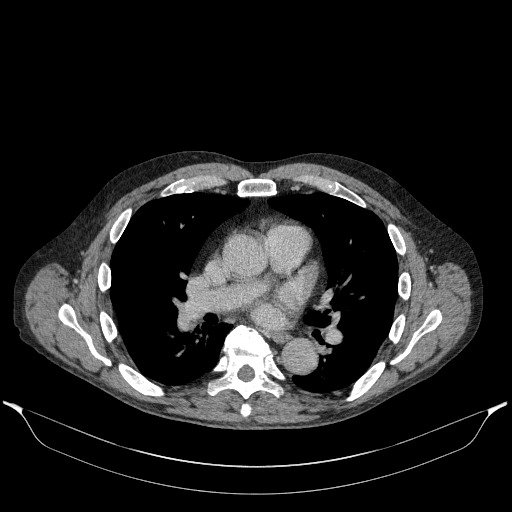
[im 208/346  soft-tissue]
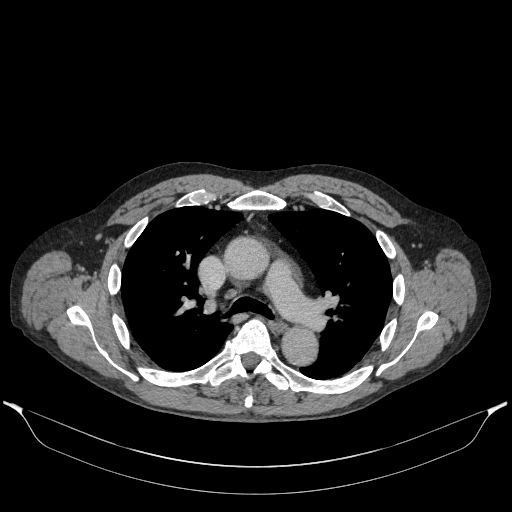
[im 225/346  soft-tissue]
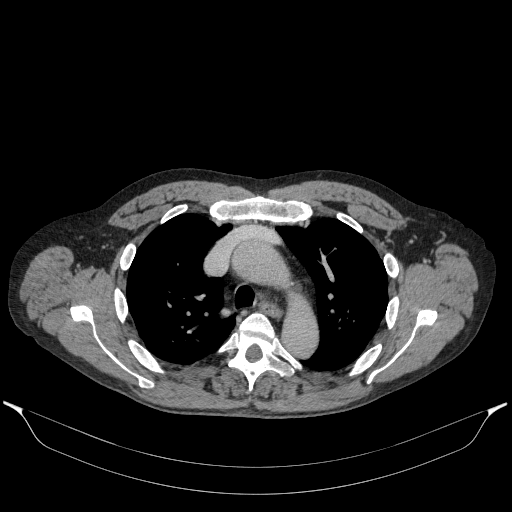
[im 259/346  soft-tissue]
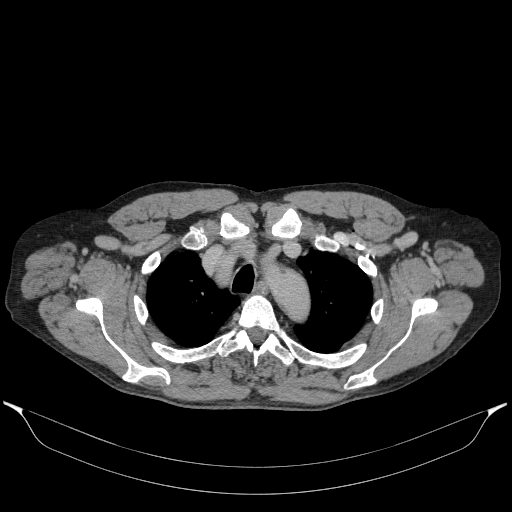
[im 259/346  bone]
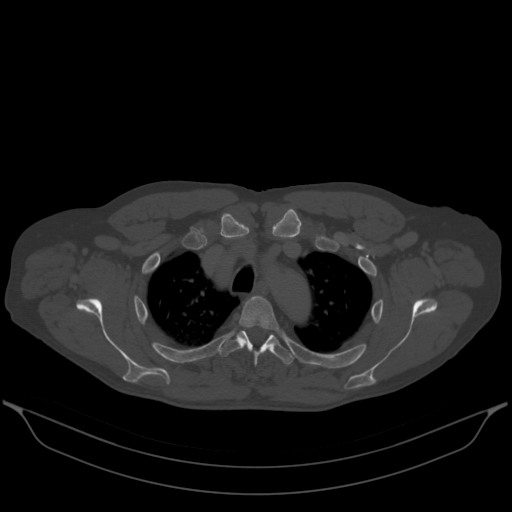
[im 277/346  lung]
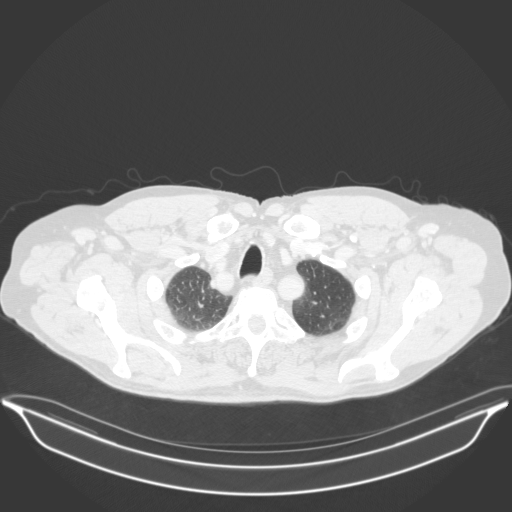
[im 294/346  soft-tissue]
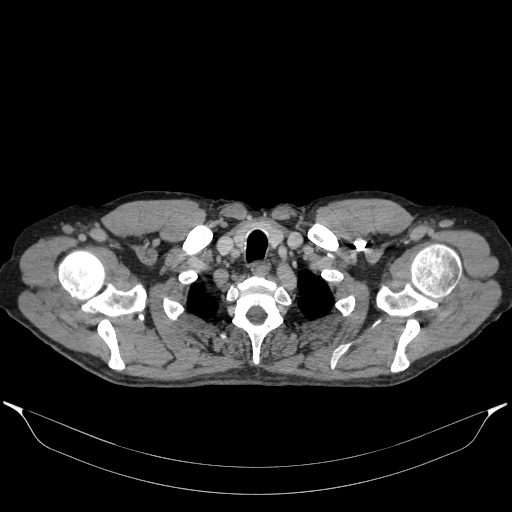
[im 294/346  lung]
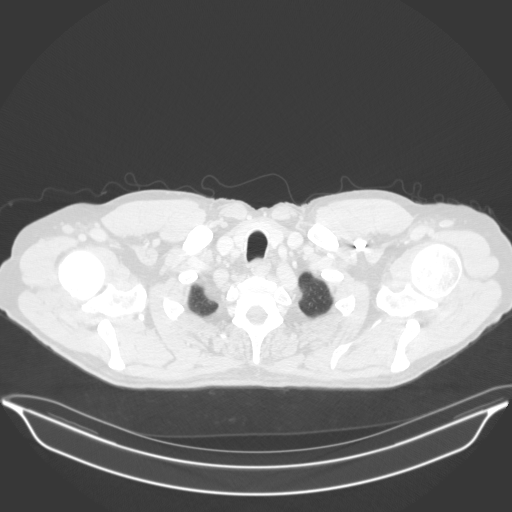
[im 311/346  lung]
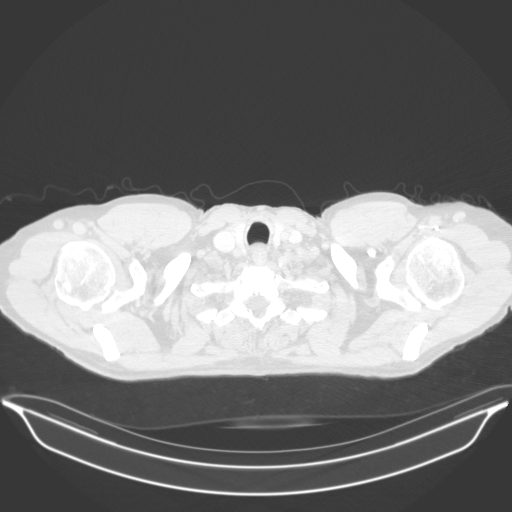
[im 328/346  soft-tissue]
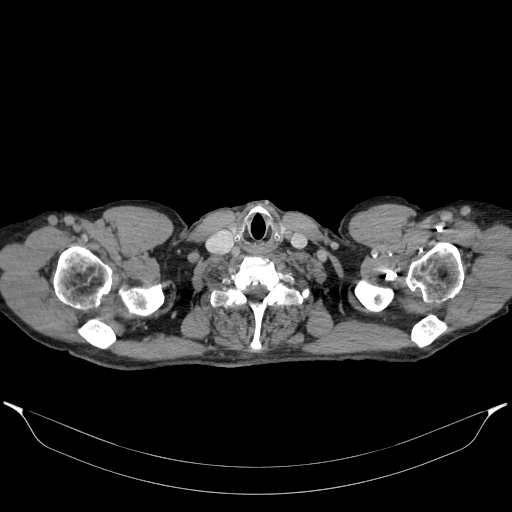
[im 328/346  lung]
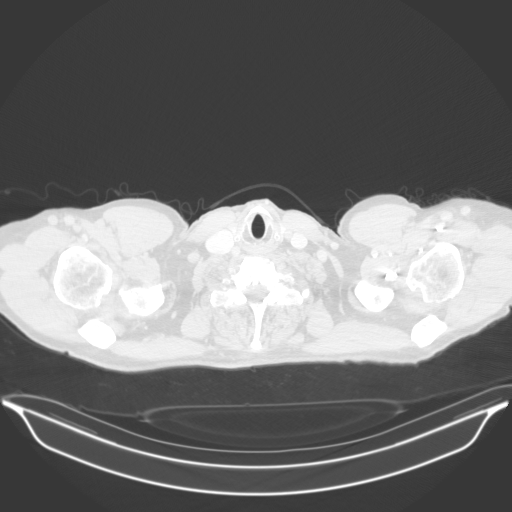

[13 of 32 positions shown; findings below may reference images not displayed]

RADIATION DOSE REDUCTION: This exam was performed according to the
departmental dose-optimization program which includes automated
exposure control, adjustment of the mA and/or kV according to
patient size and/or use of iterative reconstruction technique.

CONTRAST:  100mL 3AAMMH-VSS IOPAMIDOL (3AAMMH-VSS) INJECTION 61%
FINDINGS: CT CHEST FINDINGS

Motion degraded scan limits assessment.

Cardiovascular: Top-normal heart size. No significant pericardial
effusion/thickening. Left anterior descending and right coronary
atherosclerosis. Mildly atherosclerotic nonaneurysmal thoracic
aorta. Normal caliber pulmonary arteries. No central pulmonary
emboli.

Mediastinum/Nodes: No discrete thyroid nodules. Unremarkable
esophagus. No pathologically enlarged axillary, mediastinal or hilar
lymph nodes.

Lungs/Pleura: No pneumothorax. No pleural effusion. No acute
consolidative airspace disease, lung masses or significant pulmonary
nodules.

Musculoskeletal: No aggressive appearing focal osseous lesions.
Moderate thoracic spondylosis.

CT ABDOMEN PELVIS FINDINGS

Hepatobiliary: Normal liver size. Hypodense 0.5 cm inferior right
liver lesion (series 2/image 65), too small to characterize,
measuring 0.3 cm on 09/14/2008 CT, very likely benign. No additional
liver lesions. Cholelithiasis. No biliary ductal dilatation.

Pancreas: Normal, with no mass or duct dilation.

Spleen: Normal size. No mass.

Adrenals/Urinary Tract: Normal adrenals. No hydronephrosis. Simple
3.3 cm medial interpolar right renal cyst, separate hypodense
posterior interpolar right renal cortical lesion, too small to
characterize, and small simple lower left renal parapelvic cyst, for
which no follow-up is recommended. Two stones layering in the left
bladder, measuring 10 mm and 8 mm. Mild diffuse bladder wall
thickening with scattered tiny bladder diverticula. Mass-effect on
the bladder base by the enlarged nodular median lobe of the
prostate.

Stomach/Bowel: Normal non-distended stomach. Normal caliber small
bowel with no small bowel wall thickening. Appendix not discretely
visualized. Oral contrast transits to the rectum. Ill-defined
cecal/ascending colonic 4.8 x 3.7 cm mass (series 2/image 83),
abutting the superior margin of the ileocecal valve.

Vascular/Lymphatic: Atherosclerotic nonaneurysmal abdominal aorta.
Patent portal, splenic, hepatic and renal veins. Small rounded
cm right ileocolic mesentery lymph node (series 2/image 81). No
pathologically enlarged lymph nodes in the abdomen or pelvis.

Reproductive: Mild-to-moderate prostatomegaly with coarse
nonspecific internal prostatic calcifications. Small bilateral
hydroceles.

Other: No pneumoperitoneum, ascites or focal fluid collection.

Musculoskeletal: No aggressive appearing focal osseous lesions.
Marked lumbar spondylosis.
IMPRESSION: 1. Ill-defined cecal/ascending colon 4.8 cm mass, abutting the
superior margin of the ileocecal valve, compatible with known
primary colonic malignancy.
2. Small rounded 0.5 cm right ileocolic mesentery lymph node,
equivocal for locoregional metastatic disease.
3. No findings suspicious for distant metastatic disease in the
chest, abdomen or pelvis.
4. A tiny 0.5 cm inferior right liver hypodense lesion measured
cm on a 2996 CT study, almost certainly benign.
5. Chronic findings include: Two-vessel coronary atherosclerosis.
Cholelithiasis. Mild diffuse bladder wall thickening with scattered
tiny bladder diverticula, compatible with chronic bladder outlet
obstruction by the enlarged prostate. Two layering bladder stones.
Small bilateral hydroceles. Aortic Atherosclerosis ([id number]).

## 2024-03-11 ENCOUNTER — Telehealth: Payer: Self-pay | Admitting: Oncology

## 2024-03-11 NOTE — Telephone Encounter (Signed)
 Rescheduled appt.  Pt aware of scheduled appt details.

## 2024-04-07 DIAGNOSIS — Z1212 Encounter for screening for malignant neoplasm of rectum: Secondary | ICD-10-CM | POA: Diagnosis not present

## 2024-04-07 DIAGNOSIS — D509 Iron deficiency anemia, unspecified: Secondary | ICD-10-CM | POA: Diagnosis not present

## 2024-04-07 DIAGNOSIS — E785 Hyperlipidemia, unspecified: Secondary | ICD-10-CM | POA: Diagnosis not present

## 2024-04-08 DIAGNOSIS — E785 Hyperlipidemia, unspecified: Secondary | ICD-10-CM | POA: Diagnosis not present

## 2024-04-08 DIAGNOSIS — D509 Iron deficiency anemia, unspecified: Secondary | ICD-10-CM | POA: Diagnosis not present

## 2024-04-08 DIAGNOSIS — N401 Enlarged prostate with lower urinary tract symptoms: Secondary | ICD-10-CM | POA: Diagnosis not present

## 2024-04-08 DIAGNOSIS — I2510 Atherosclerotic heart disease of native coronary artery without angina pectoris: Secondary | ICD-10-CM | POA: Diagnosis not present

## 2024-04-14 DIAGNOSIS — Z1339 Encounter for screening examination for other mental health and behavioral disorders: Secondary | ICD-10-CM | POA: Diagnosis not present

## 2024-04-14 DIAGNOSIS — D509 Iron deficiency anemia, unspecified: Secondary | ICD-10-CM | POA: Diagnosis not present

## 2024-04-14 DIAGNOSIS — Z0000 Encounter for general adult medical examination without abnormal findings: Secondary | ICD-10-CM | POA: Diagnosis not present

## 2024-04-14 DIAGNOSIS — C186 Malignant neoplasm of descending colon: Secondary | ICD-10-CM | POA: Diagnosis not present

## 2024-04-14 DIAGNOSIS — I700 Atherosclerosis of aorta: Secondary | ICD-10-CM | POA: Diagnosis not present

## 2024-04-14 DIAGNOSIS — E785 Hyperlipidemia, unspecified: Secondary | ICD-10-CM | POA: Diagnosis not present

## 2024-04-14 DIAGNOSIS — N400 Enlarged prostate without lower urinary tract symptoms: Secondary | ICD-10-CM | POA: Diagnosis not present

## 2024-04-14 DIAGNOSIS — I2510 Atherosclerotic heart disease of native coronary artery without angina pectoris: Secondary | ICD-10-CM | POA: Diagnosis not present

## 2024-04-14 DIAGNOSIS — F03A0 Unspecified dementia, mild, without behavioral disturbance, psychotic disturbance, mood disturbance, and anxiety: Secondary | ICD-10-CM | POA: Diagnosis not present

## 2024-04-14 DIAGNOSIS — Z1331 Encounter for screening for depression: Secondary | ICD-10-CM | POA: Diagnosis not present

## 2024-04-14 DIAGNOSIS — G3184 Mild cognitive impairment, so stated: Secondary | ICD-10-CM | POA: Diagnosis not present

## 2024-04-30 ENCOUNTER — Ambulatory Visit: Payer: Medicare HMO | Admitting: Oncology

## 2024-04-30 ENCOUNTER — Other Ambulatory Visit: Payer: Medicare HMO

## 2024-05-06 ENCOUNTER — Inpatient Hospital Stay: Attending: Oncology | Admitting: Oncology

## 2024-05-06 VITALS — BP 116/76 | HR 71 | Temp 98.0°F | Resp 17 | Ht 68.0 in | Wt 156.7 lb

## 2024-05-06 DIAGNOSIS — C182 Malignant neoplasm of ascending colon: Secondary | ICD-10-CM

## 2024-05-06 DIAGNOSIS — D509 Iron deficiency anemia, unspecified: Secondary | ICD-10-CM | POA: Insufficient documentation

## 2024-05-06 DIAGNOSIS — Z85038 Personal history of other malignant neoplasm of large intestine: Secondary | ICD-10-CM | POA: Diagnosis not present

## 2024-05-06 NOTE — Progress Notes (Signed)
  [facility] OFFICE PROGRESS NOTE   Diagnosis: Colon cancer  INTERVAL HISTORY:   David Mahoney returns as scheduled.  He is here with his daughter.  He feels well.  No complaint.  He reports a good appetite.  He is now living independently at [facility].  He does not eat in the dining hall.  No difficulty with bowel function.  No bleeding.  He is taking no medications.  Objective:  Vital signs in last 24 hours:  Blood pressure 116/76, pulse 71, temperature 98 F (36.7 C), temperature source Temporal, resp. rate 17, height 5' 8 (1.727 m), weight 156 lb 11.2 oz (71.1 kg), SpO2 100%.     Lymphatics: No cervical, supraclavicular, axillary, or inguinal nodes Resp: Lungs clear bilaterally Cardio: Regular rate and rhythm GI: No mass, nontender, no hepatosplenomegaly Vascular: No leg edema Neurologic: Alert, follows commands, not oriented to day, year, or place  Lab Results:  Lab Results  Component Value Date   WBC 5.9 05/01/2023   HGB 15.8 05/01/2023   HCT 45.9 05/01/2023   MCV 93.7 05/01/2023   PLT 253 05/01/2023   NEUTROABS 3.8 05/01/2023    CMP  Lab Results  Component Value Date   NA 141 03/09/2022   K 4.0 03/09/2022   CL 112 (H) 03/09/2022   CO2 24 03/09/2022   GLUCOSE 120 (H) 03/09/2022   BUN 13 03/09/2022   CREATININE 1.15 03/09/2022   CALCIUM 8.8 (L) 03/09/2022   PROT 6.5 02/23/2022   ALBUMIN 4.1 02/23/2022   AST 17 02/23/2022   ALT 19 02/23/2022   ALKPHOS 41 02/23/2022   BILITOT 1.1 02/23/2022   GFRNONAA >60 03/09/2022   GFRAA  09/14/2008    >60        The eGFR has been calculated using the MDRD equation. This calculation has not been validated in all clinical    Lab Results  Component Value Date   CEA 3.06 05/01/2023      Medications: I have reviewed the patient's current medications.   Assessment/Plan:  Colon cancer, stage I-colonoscopy 02/25/2022-mass at the ascending colon/cecum-biopsy invasive moderately  differentiated adenocarcinoma CTs 02/28/2022-cecal/ascending colon mass, 0.5 cm right ileocolic mesenteric node, no evidence of distant metastatic disease, 0.5 cm hypodense inferior right liver lesion-0.3 cm on a CT in 2009 Robotic right colectomy 03/27/2022, moderately differentiated adenocarcinoma of the cecum invading submucosa, no lymphovascular perineural invasion, 22 negative lymph nodes,pT1pN0, MSS, mismatch repair protein expression intact  Iron deficiency anemia secondary to #1-normal Cognitive impairment BPH Hearing loss Hyperlipidemia Tubular adenoma on the colonoscopy 02/25/2022    Disposition: David Mahoney was diagnosed with stage I colon cancer in May 2023.  He is in clinical remission.  He has significant dementia.  I suspect the weight loss is due to dementia.  I discussed the indication for a surveillance colonoscopy with his daughter.  They have decided against surveillance.  He will continue clinical follow-up with Dr. Onita.  He is not scheduled for a follow-up appointment in the oncology clinic.  I am available to see him as needed.    Arley Hof, MD  05/06/2024  12:18 PM

## 2024-06-23 DIAGNOSIS — H52223 Regular astigmatism, bilateral: Secondary | ICD-10-CM | POA: Diagnosis not present
